# Patient Record
Sex: Female | Born: 1940 | Race: White | Hispanic: No | Marital: Single | Smoking: Former smoker
Health system: Southern US, Community
[De-identification: ages and names within clinical notes are randomized; demographics above are authoritative.]

## PROBLEM LIST (undated history)

## (undated) DIAGNOSIS — C55 Malignant neoplasm of uterus, part unspecified: Secondary | ICD-10-CM

## (undated) DIAGNOSIS — K5792 Diverticulitis of intestine, part unspecified, without perforation or abscess without bleeding: Secondary | ICD-10-CM

## (undated) DIAGNOSIS — C50919 Malignant neoplasm of unspecified site of unspecified female breast: Secondary | ICD-10-CM

## (undated) DIAGNOSIS — I10 Essential (primary) hypertension: Secondary | ICD-10-CM

## (undated) HISTORY — PX: ABDOMINAL HYSTERECTOMY: SHX81

## (undated) HISTORY — PX: EYE SURGERY: SHX253

## (undated) HISTORY — PX: BREAST LUMPECTOMY: SHX2

---

## 2018-04-13 ENCOUNTER — Emergency Department (HOSPITAL_BASED_OUTPATIENT_CLINIC_OR_DEPARTMENT_OTHER): Payer: Medicare Other

## 2018-04-13 ENCOUNTER — Encounter (HOSPITAL_BASED_OUTPATIENT_CLINIC_OR_DEPARTMENT_OTHER): Payer: Self-pay

## 2018-04-13 ENCOUNTER — Other Ambulatory Visit: Payer: Self-pay

## 2018-04-13 ENCOUNTER — Inpatient Hospital Stay (HOSPITAL_BASED_OUTPATIENT_CLINIC_OR_DEPARTMENT_OTHER)
Admission: EM | Admit: 2018-04-13 | Discharge: 2018-04-17 | DRG: 389 | Disposition: A | Discharge status: Home / Self Care | Payer: Medicare Other | Attending: Internal Medicine | Admitting: Internal Medicine

## 2018-04-13 DIAGNOSIS — E162 Hypoglycemia, unspecified: Secondary | ICD-10-CM | POA: Diagnosis not present

## 2018-04-13 DIAGNOSIS — N3000 Acute cystitis without hematuria: Secondary | ICD-10-CM | POA: Diagnosis not present

## 2018-04-13 DIAGNOSIS — Z888 Allergy status to other drugs, medicaments and biological substances status: Secondary | ICD-10-CM | POA: Diagnosis not present

## 2018-04-13 DIAGNOSIS — Z8249 Family history of ischemic heart disease and other diseases of the circulatory system: Secondary | ICD-10-CM | POA: Diagnosis not present

## 2018-04-13 DIAGNOSIS — I10 Essential (primary) hypertension: Secondary | ICD-10-CM | POA: Diagnosis present

## 2018-04-13 DIAGNOSIS — Z0189 Encounter for other specified special examinations: Secondary | ICD-10-CM

## 2018-04-13 DIAGNOSIS — K5651 Intestinal adhesions [bands], with partial obstruction: Principal | ICD-10-CM | POA: Diagnosis present

## 2018-04-13 DIAGNOSIS — N179 Acute kidney failure, unspecified: Secondary | ICD-10-CM | POA: Diagnosis present

## 2018-04-13 DIAGNOSIS — K56609 Unspecified intestinal obstruction, unspecified as to partial versus complete obstruction: Secondary | ICD-10-CM | POA: Diagnosis present

## 2018-04-13 DIAGNOSIS — Z8542 Personal history of malignant neoplasm of other parts of uterus: Secondary | ICD-10-CM | POA: Diagnosis not present

## 2018-04-13 DIAGNOSIS — Z90710 Acquired absence of both cervix and uterus: Secondary | ICD-10-CM

## 2018-04-13 DIAGNOSIS — Z79899 Other long term (current) drug therapy: Secondary | ICD-10-CM

## 2018-04-13 DIAGNOSIS — Z853 Personal history of malignant neoplasm of breast: Secondary | ICD-10-CM

## 2018-04-13 HISTORY — DX: Essential (primary) hypertension: I10

## 2018-04-13 HISTORY — DX: Malignant neoplasm of unspecified site of unspecified female breast: C50.919

## 2018-04-13 HISTORY — DX: Diverticulitis of intestine, part unspecified, without perforation or abscess without bleeding: K57.92

## 2018-04-13 HISTORY — DX: Malignant neoplasm of uterus, part unspecified: C55

## 2018-04-13 LAB — COMPREHENSIVE METABOLIC PANEL
ALK PHOS: 81 U/L (ref 38–126)
ALT: 27 U/L (ref 0–44)
AST: 39 U/L (ref 15–41)
Albumin: 4.9 g/dL (ref 3.5–5.0)
Anion gap: 14 (ref 5–15)
BUN: 54 mg/dL — ABNORMAL HIGH (ref 8–23)
CHLORIDE: 88 mmol/L — AB (ref 98–111)
CO2: 27 mmol/L (ref 22–32)
Calcium: 9.9 mg/dL (ref 8.9–10.3)
Creatinine, Ser: 2.58 mg/dL — ABNORMAL HIGH (ref 0.44–1.00)
GFR calc Af Amer: 20 mL/min — ABNORMAL LOW (ref >60)
GFR calc non Af Amer: 17 mL/min — ABNORMAL LOW (ref >60)
Glucose, Bld: 111 mg/dL — ABNORMAL HIGH (ref 70–99)
Potassium: 4.3 mmol/L (ref 3.5–5.1)
Sodium: 129 mmol/L — ABNORMAL LOW (ref 135–145)
Total Bilirubin: 1.6 mg/dL — ABNORMAL HIGH (ref 0.3–1.2)
Total Protein: 7.8 g/dL (ref 6.5–8.1)

## 2018-04-13 LAB — BASIC METABOLIC PANEL
Anion gap: 9 (ref 5–15)
BUN: 48 mg/dL — ABNORMAL HIGH (ref 8–23)
CO2: 28 mmol/L (ref 22–32)
Calcium: 9 mg/dL (ref 8.9–10.3)
Chloride: 91 mmol/L — ABNORMAL LOW (ref 98–111)
Creatinine, Ser: 2.06 mg/dL — ABNORMAL HIGH (ref 0.44–1.00)
GFR calc Af Amer: 26 mL/min — ABNORMAL LOW (ref >60)
GFR calc non Af Amer: 22 mL/min — ABNORMAL LOW (ref >60)
GLUCOSE: 127 mg/dL — AB (ref 70–99)
Potassium: 3.7 mmol/L (ref 3.5–5.1)
Sodium: 128 mmol/L — ABNORMAL LOW (ref 135–145)

## 2018-04-13 LAB — URINALYSIS, ROUTINE W REFLEX MICROSCOPIC
GLUCOSE, UA: NEGATIVE mg/dL
Ketones, ur: NEGATIVE mg/dL
Nitrite: NEGATIVE
Protein, ur: NEGATIVE mg/dL
pH: 5 (ref 5.0–8.0)

## 2018-04-13 LAB — CBC WITH DIFFERENTIAL/PLATELET
Abs Immature Granulocytes: 0.04 10*3/uL (ref 0.00–0.07)
Basophils Absolute: 0 10*3/uL (ref 0.0–0.1)
Basophils Relative: 0 %
Eosinophils Absolute: 0.1 10*3/uL (ref 0.0–0.5)
Eosinophils Relative: 1 %
HCT: 42.9 % (ref 36.0–46.0)
Hemoglobin: 13.5 g/dL (ref 12.0–15.0)
IMMATURE GRANULOCYTES: 0 %
Lymphocytes Relative: 13 %
Lymphs Abs: 1.7 10*3/uL (ref 0.7–4.0)
MCH: 30.1 pg (ref 26.0–34.0)
MCHC: 31.5 g/dL (ref 30.0–36.0)
MCV: 95.8 fL (ref 80.0–100.0)
Monocytes Absolute: 1.1 10*3/uL — ABNORMAL HIGH (ref 0.1–1.0)
Monocytes Relative: 9 %
Neutro Abs: 9.9 10*3/uL — ABNORMAL HIGH (ref 1.7–7.7)
Neutrophils Relative %: 77 %
Platelets: 277 10*3/uL (ref 150–400)
RBC: 4.48 MIL/uL (ref 3.87–5.11)
RDW: 12.8 % (ref 11.5–15.5)
WBC: 12.8 10*3/uL — ABNORMAL HIGH (ref 4.0–10.5)
nRBC: 0 % (ref 0.0–0.2)

## 2018-04-13 LAB — LIPASE, BLOOD: Lipase: 54 U/L — ABNORMAL HIGH (ref 11–51)

## 2018-04-13 LAB — URINALYSIS, MICROSCOPIC (REFLEX)

## 2018-04-13 MED ORDER — FENTANYL CITRATE (PF) 100 MCG/2ML IJ SOLN
25.0000 ug | Freq: Once | INTRAMUSCULAR | Status: AC
  Administered 2018-04-13: 25 ug via INTRAVENOUS
  Filled 2018-04-13: qty 2

## 2018-04-13 MED ORDER — ONDANSETRON HCL 4 MG/2ML IJ SOLN
4.0000 mg | Freq: Once | INTRAMUSCULAR | Status: AC
  Administered 2018-04-13: 4 mg via INTRAVENOUS

## 2018-04-13 MED ORDER — SODIUM CHLORIDE 0.9 % IV SOLN
INTRAVENOUS | Status: DC | PRN
  Administered 2018-04-13: 250 mL via INTRAVENOUS

## 2018-04-13 MED ORDER — LACTATED RINGERS IV BOLUS
2000.0000 mL | Freq: Once | INTRAVENOUS | Status: AC
  Administered 2018-04-13: 2000 mL via INTRAVENOUS

## 2018-04-13 MED ORDER — ONDANSETRON HCL 4 MG/2ML IJ SOLN
INTRAMUSCULAR | Status: AC
  Filled 2018-04-13: qty 2

## 2018-04-13 MED ORDER — SODIUM CHLORIDE 0.9 % IV SOLN
1.0000 g | Freq: Once | INTRAVENOUS | Status: AC
  Administered 2018-04-13: 1 g via INTRAVENOUS
  Filled 2018-04-13: qty 10

## 2018-04-13 NOTE — ED Notes (Signed)
US in progress at bedside.

## 2018-04-13 NOTE — ED Provider Notes (Signed)
Emergency Department Provider Note   I have reviewed the triage vital signs and the nursing notes.   HISTORY  Chief Complaint Abdominal Pain   HPI Chelsea Petersen is a 78 y.o. female with a history of uterine cancer, breast cancer, diverticulitis hypertension the presents to the emergency department today secondary to lower abdominal pain.  Patient states that she started having some pain Tuesday afternoon and in between Tuesday and Wednesday she had 3 or 4 episodes of nonbloody nonbilious vomiting it was just stomach acid.  Patient states that she has not vomited since yesterday however the pain is localized to just her suprapubic area.  She states that originally was more widespread but now is just there.  Maybe a little bit off the left side as well.  This is not the place where she previously had diverticulitis.  She is not had any fevers.  She has not had any diarrhea in fact is not had a bowel movement since Tuesday either.  She does have a history of a hysterectomy.  No history of bowel obstructions.  She usually takes fiber but secondary to her pain, nausea and vomiting she actually has not taken her fiber in the last couple days.  She states that she did not urinate for approximate 24 hours but she did about an hour prior to coming here and it seemed to be orange in color however she attributes this to having orange Pedialyte prior to coming in.  She feels that she needs to urinate again at this time. No other associated or modifying symptoms.    Past Medical History:  Diagnosis Date  . Breast cancer ([place])   . Diverticulitis   . Hypertension   . Uterine cancer [facility])     Patient Active Problem List   Diagnosis Date Noted  . SBO (small bowel obstruction) ([place]) 04/13/2018    Past Surgical History:  Procedure Laterality Date  . ABDOMINAL HYSTERECTOMY    . BREAST LUMPECTOMY    . EYE SURGERY        Allergies Other  No family history on file.  Social History Social  History   Tobacco Use  . Smoking status: Never Smoker  . Smokeless tobacco: Never Used  Substance Use Topics  . Alcohol use: Yes    Comment: occ  . Drug use: Never    Review of Systems  All other systems negative except as documented in the HPI. All pertinent positives and negatives as reviewed in the HPI. ____________________________________________   PHYSICAL EXAM:  VITAL SIGNS: ED Triage Vitals  Enc Vitals Group     BP 04/13/18 1605 (!) 141/82     Pulse Rate 04/13/18 1605 98     Resp 04/13/18 1605 16     Temp 04/13/18 1605 98.1 F (36.7 C)     Temp Source 04/13/18 1605 Oral     SpO2 04/13/18 1605 100 %     Weight 04/13/18 1605 110 lb (49.9 kg)     Height 04/13/18 1605 5' (1.524 m)    Constitutional: Alert and oriented. Well appearing and in no acute distress. Eyes: Conjunctivae are normal. PERRL. EOMI. Head: Atraumatic. Nose: No congestion/rhinnorhea. Mouth/Throat: Mucous membranes are moist.  Oropharynx non-erythematous. Neck: No stridor.  No meningeal signs.   Cardiovascular: Normal rate, regular rhythm. Good peripheral circulation. Grossly normal heart sounds.   Respiratory: Normal respiratory effort.  No retractions. Lungs CTAB. Gastrointestinal: Soft and ttp suprapubic with guarding, no rebound, no pain with heel tap. Mild distention.  Musculoskeletal: No lower extremity tenderness nor edema. No gross deformities of extremities. Neurologic:  Normal speech and language. No gross focal neurologic deficits are appreciated.  Skin:  Skin is warm, dry and intact. No rash noted.  ____________________________________________   LABS (all labs ordered are listed, but only abnormal results are displayed)  Labs Reviewed  URINALYSIS, ROUTINE W REFLEX MICROSCOPIC - Abnormal; Notable for the following components:      Result Value   APPearance HAZY (*)    Specific Gravity, Urine >1.030 (*)    Hgb urine dipstick TRACE (*)    Bilirubin Urine SMALL (*)    Leukocytes,Ua  TRACE (*)    All other components within normal limits  CBC WITH DIFFERENTIAL/PLATELET - Abnormal; Notable for the following components:   WBC 12.8 (*)    Neutro Abs 9.9 (*)    Monocytes Absolute 1.1 (*)    All other components within normal limits  COMPREHENSIVE METABOLIC PANEL - Abnormal; Notable for the following components:   Sodium 129 (*)    Chloride 88 (*)    Glucose, Bld 111 (*)    BUN 54 (*)    Creatinine, Ser 2.58 (*)    Total Bilirubin 1.6 (*)    GFR calc non Af Amer 17 (*)    GFR calc Af Amer 20 (*)    All other components within normal limits  LIPASE, BLOOD - Abnormal; Notable for the following components:   Lipase 54 (*)    All other components within normal limits  URINALYSIS, MICROSCOPIC (REFLEX) - Abnormal; Notable for the following components:   Bacteria, UA FEW (*)    All other components within normal limits  BASIC METABOLIC PANEL - Abnormal; Notable for the following components:   Sodium 128 (*)    Chloride 91 (*)    Glucose, Bld 127 (*)    BUN 48 (*)    Creatinine, Ser 2.06 (*)    GFR calc non Af Amer 22 (*)    GFR calc Af Amer 26 (*)    All other components within normal limits  URINE CULTURE   ____________________________________________  EKG   EKG Interpretation  Date/Time:    Ventricular Rate:    PR Interval:    QRS Duration:   QT Interval:    QTC Calculation:   R Axis:     Text Interpretation:         ____________________________________________  RADIOLOGY  Ct Abdomen Pelvis Wo Contrast  Result Date: 04/13/2018 CLINICAL DATA:  Lower abdominal pain with nausea and vomiting for 2 days. EXAM: CT ABDOMEN AND PELVIS WITHOUT CONTRAST TECHNIQUE: Multidetector CT imaging of the abdomen and pelvis was performed following the standard protocol without IV contrast. COMPARISON:  Ultrasound kidneys 04/13/2018 FINDINGS: Lower chest: Atelectasis in the lung bases. 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 Coronary artery  calcifications. Mild cardiac enlargement. Hepatobiliary: No focal liver lesions. Gallbladder is contracted but otherwise normal. No bile duct dilatation. Pancreas: Unremarkable. No pancreatic ductal dilatation or surrounding inflammatory changes. Spleen: Normal in size without focal abnormality. Adrenals/Urinary Tract: No adrenal gland nodules. Stone in the lower pole left kidney measuring 3 mm diameter. No hydronephrosis or hydroureter. Bladder is unremarkable. Stomach/Bowel: Market distention of fluid-filled stomach and small bowel. Distal small bowel are decompressed. Transition zone appears to be in the left lower quadrant. No cause is identified, suggesting probable adhesions. Appearance suggest high-grade obstruction. Scattered stool throughout the colon without colonic distention. Colonic diverticulosis without evidence of diverticulitis. Appendix is normal.  Vascular/Lymphatic: Extensive aortic and vascular calcifications. Reproductive: Status post hysterectomy. No adnexal masses. Other: Small amount of free fluid in the upper abdomen, likely ascites or reactive fluid. No free air. Abdominal wall musculature appears intact. Musculoskeletal: Degenerative changes in the lumbar spine. Mild scoliosis convex towards the right, likely degenerative. IMPRESSION: 1. High-grade small bowel obstruction with transition zone in the left lower quadrant. No cause is identified, suggesting adhesions. 2. Small amount of free fluid in the abdomen, likely ascites or reactive fluid. 3. Nonobstructing stone in the lower pole left kidney. 4. Colonic diverticulosis without evidence of diverticulitis. Aortic Atherosclerosis (ICD10-I70.0). Electronically Signed   By: Lucienne Capers M.D.   On: 04/13/2018 22:49   US Renal  Result Date: 04/13/2018 CLINICAL DATA:  Renal failure EXAM: RENAL / URINARY TRACT ULTRASOUND COMPLETE COMPARISON:  None. FINDINGS: Right Kidney: Renal measurements: 9.5 x 5.2 x 5.1 cm = volume: 132 mL .  Echogenicity within normal limits. No mass or hydronephrosis visualized. Left Kidney: Renal measurements: 9.4 x 4.6 x 4.0 cm = volume: 91 mL. Echogenicity within normal limits. No mass or hydronephrosis visualized. Small echogenic foci within the left kidney, the largest 5 mm in the lower pole, likely nonobstructing stones. Bladder: Decompressed, grossly unremarkable. IMPRESSION: No acute findings.  No hydronephrosis. Suspect small nonobstructing left renal stones. Electronically Signed   By: Rolm Baptise M.D.   On: 04/13/2018 19:06    ____________________________________________   PROCEDURES  Procedure(s) performed:   Procedures   ____________________________________________   INITIAL IMPRESSION / ASSESSMENT AND PLAN / ED COURSE  Patient overall appears well however does not make sense for her to have such localized pain and tenderness if it was just a viral gastroenteritis.  She also did not have diarrhea does not really have crampy pain and did not have profuse vomiting.  Other concern would be for small bowel obstruction since she is not had a bowel movement in the last 2 or 3 days or large bowel obstruction for that matter.  Her abdomen is mildly distended and not diffusely tender not tympanic to percussion making a little bit less likely.  Also consider urinary tract infection with the suprapubic pain decreased urine output and abnormal color to her urine.  Plan at this time will check some basic blood work and urinalysis.  Postvoid residual was 0.  If her urine comes back obviously infected we will treat that however I will have a low threshold for CT scan for bowel obstruction if any question about urinary tract infection.  Urine does look infected and she has acute kidney injury so gust with the patient her daughter who is a Equities trader and we agreed to fluid hydrate and treat her urinary tract infection recheck her BMP to ensure her kidney function started to improve prior to  discharge.  Reevaluation after the fluids and improving BUN/creatinine patient stated her abdomen pain seemed to feel that worse.  On exam her pain initially was only in the suprapubic area but now she was having tenderness in the left lower quadrant left upper quadrant epigastric area.  She started having some tympany to percussion and mild guarding.  I discussed with him that I was worried that she might have a bowel obstruction as her kidney function did not improve as much as expected to she still has not had any bowel movement here and not passing gas.  CT scan was performed which did show a high-grade small bowel obstruction.  Discussed with Dr. Harlow Asa at [facility]  who will see her in consult per request medicine admission.  Discussed with Dr. Eugenia Pancoast who will admit.     Pertinent labs & imaging results that were available during my care of the patient were reviewed by me and considered in my medical decision making (see chart for details).  ____________________________________________  FINAL CLINICAL IMPRESSION(S) / ED DIAGNOSES  Final diagnoses:  Small bowel obstruction ([place])  AKI (acute kidney injury) ([place])  Acute cystitis without hematuria     MEDICATIONS GIVEN DURING THIS VISIT:  Medications  0.9 %  sodium chloride infusion ( Intravenous Stopped 04/13/18 1917)  lactated ringers bolus 2,000 mL ( Intravenous Stopped 04/13/18 1951)  cefTRIAXone (ROCEPHIN) 1 g in sodium chloride 0.9 % 100 mL IVPB ( Intravenous Stopped 04/13/18 1815)  fentaNYL (SUBLIMAZE) injection 25 mcg (25 mcg Intravenous Given 04/13/18 2319)  ondansetron (ZOFRAN) injection 4 mg (4 mg Intravenous Given 04/13/18 2319)     NEW OUTPATIENT MEDICATIONS STARTED DURING THIS VISIT:  New Prescriptions   No medications on file    Note:  This note was prepared with assistance of Dragon voice recognition software. Occasional wrong-word or sound-a-like substitutions may have occurred due to the inherent limitations of  voice recognition software.   Raif Chachere, Corene Cornea, MD 04/14/18 [phone]

## 2018-04-13 NOTE — Progress Notes (Signed)
78 y.o. female with history of hysterectomy, who I have accepted for transfer from [place] [facility] ED to [facility] for further evaluation and management of SBO, after the patient presented to the former facility complaining of 1 to 2 days of abdominal discomfort, diminished flatus production, and no bowel movement since 04/12/2018.  CT abdomen/pelvis showed evidence of high-grade small bowel obstruction with transition zone in the left lower quadrant without evidence of abscess or perforation.   In accepting this patient for transfer to [facility], I spoke with Dr. Merrily Pew, MD, who was the ED physician at [facility].   Babs Bertin, DO Hospitalist

## 2018-04-13 NOTE — ED Triage Notes (Signed)
C/o lower abd pain, n/v x 2 days-NAD-steady gait

## 2018-04-14 ENCOUNTER — Encounter (HOSPITAL_COMMUNITY): Payer: Self-pay | Admitting: Internal Medicine

## 2018-04-14 ENCOUNTER — Inpatient Hospital Stay (HOSPITAL_COMMUNITY): Payer: Medicare Other

## 2018-04-14 DIAGNOSIS — N3000 Acute cystitis without hematuria: Secondary | ICD-10-CM

## 2018-04-14 DIAGNOSIS — K56609 Unspecified intestinal obstruction, unspecified as to partial versus complete obstruction: Secondary | ICD-10-CM

## 2018-04-14 DIAGNOSIS — N179 Acute kidney failure, unspecified: Secondary | ICD-10-CM

## 2018-04-14 DIAGNOSIS — Z0189 Encounter for other specified special examinations: Secondary | ICD-10-CM

## 2018-04-14 DIAGNOSIS — I10 Essential (primary) hypertension: Secondary | ICD-10-CM | POA: Diagnosis present

## 2018-04-14 LAB — CBC WITH DIFFERENTIAL/PLATELET
ABS IMMATURE GRANULOCYTES: 0.04 10*3/uL (ref 0.00–0.07)
Basophils Absolute: 0 10*3/uL (ref 0.0–0.1)
Basophils Relative: 0 %
Eosinophils Absolute: 0 10*3/uL (ref 0.0–0.5)
Eosinophils Relative: 0 %
HCT: 40.2 % (ref 36.0–46.0)
Hemoglobin: 13 g/dL (ref 12.0–15.0)
Immature Granulocytes: 0 %
LYMPHS PCT: 9 %
Lymphs Abs: 0.9 10*3/uL (ref 0.7–4.0)
MCH: 31.1 pg (ref 26.0–34.0)
MCHC: 32.3 g/dL (ref 30.0–36.0)
MCV: 96.2 fL (ref 80.0–100.0)
Monocytes Absolute: 1.1 10*3/uL — ABNORMAL HIGH (ref 0.1–1.0)
Monocytes Relative: 11 %
Neutro Abs: 7.8 10*3/uL — ABNORMAL HIGH (ref 1.7–7.7)
Neutrophils Relative %: 80 %
Platelets: 237 10*3/uL (ref 150–400)
RBC: 4.18 MIL/uL (ref 3.87–5.11)
RDW: 12.6 % (ref 11.5–15.5)
WBC: 10 10*3/uL (ref 4.0–10.5)
nRBC: 0 % (ref 0.0–0.2)

## 2018-04-14 LAB — COMPREHENSIVE METABOLIC PANEL
ALT: 25 U/L (ref 0–44)
AST: 28 U/L (ref 15–41)
Albumin: 3.8 g/dL (ref 3.5–5.0)
Alkaline Phosphatase: 64 U/L (ref 38–126)
Anion gap: 9 (ref 5–15)
BILIRUBIN TOTAL: 1.3 mg/dL — AB (ref 0.3–1.2)
BUN: 38 mg/dL — ABNORMAL HIGH (ref 8–23)
CO2: 29 mmol/L (ref 22–32)
Calcium: 9.3 mg/dL (ref 8.9–10.3)
Chloride: 94 mmol/L — ABNORMAL LOW (ref 98–111)
Creatinine, Ser: 1.42 mg/dL — ABNORMAL HIGH (ref 0.44–1.00)
GFR calc Af Amer: 41 mL/min — ABNORMAL LOW (ref >60)
GFR, EST NON AFRICAN AMERICAN: 35 mL/min — AB (ref >60)
Glucose, Bld: 117 mg/dL — ABNORMAL HIGH (ref 70–99)
Potassium: 3.8 mmol/L (ref 3.5–5.1)
Sodium: 132 mmol/L — ABNORMAL LOW (ref 135–145)
TOTAL PROTEIN: 6.3 g/dL — AB (ref 6.5–8.1)

## 2018-04-14 LAB — GLUCOSE, CAPILLARY
Glucose-Capillary: 96 mg/dL (ref 70–99)
Glucose-Capillary: 72 mg/dL (ref 70–99)

## 2018-04-14 MED ORDER — ONDANSETRON HCL 4 MG/2ML IJ SOLN
4.0000 mg | Freq: Four times a day (QID) | INTRAMUSCULAR | Status: DC | PRN
  Administered 2018-04-14 (×2): 4 mg via INTRAVENOUS
  Filled 2018-04-14 (×2): qty 2

## 2018-04-14 MED ORDER — HYDRALAZINE HCL 20 MG/ML IJ SOLN: 5.0000 mg | INTRAMUSCULAR | Status: DC | PRN

## 2018-04-14 MED ORDER — LACTATED RINGERS IV SOLN
INTRAVENOUS | Status: AC
  Administered 2018-04-14 – 2018-04-15 (×2): via INTRAVENOUS

## 2018-04-14 MED ORDER — SODIUM CHLORIDE 0.9 % IV SOLN
1.0000 g | INTRAVENOUS | Status: DC
  Administered 2018-04-14 – 2018-04-16 (×3): 1 g via INTRAVENOUS
  Filled 2018-04-14 (×3): qty 1

## 2018-04-14 MED ORDER — FENTANYL CITRATE (PF) 100 MCG/2ML IJ SOLN: 25.0000 ug | INTRAMUSCULAR | Status: DC | PRN

## 2018-04-14 MED ORDER — DIATRIZOATE MEGLUMINE & SODIUM 66-10 % PO SOLN
90.0000 mL | Freq: Once | ORAL | Status: AC
  Administered 2018-04-14: 90 mL via NASOGASTRIC
  Filled 2018-04-14: qty 90

## 2018-04-14 MED ORDER — ACETAMINOPHEN 325 MG PO TABS
650.0000 mg | ORAL_TABLET | Freq: Four times a day (QID) | ORAL | Status: DC | PRN
  Administered 2018-04-17: 650 mg via ORAL
  Filled 2018-04-14: qty 2

## 2018-04-14 MED ORDER — ONDANSETRON HCL 4 MG PO TABS: 4.0000 mg | ORAL_TABLET | Freq: Four times a day (QID) | ORAL | Status: DC | PRN

## 2018-04-14 MED ORDER — ACETAMINOPHEN 650 MG RE SUPP: 650.0000 mg | Freq: Four times a day (QID) | RECTAL | Status: DC | PRN

## 2018-04-14 NOTE — H&P (Signed)
History and Physical    Chelsea Petersen NGE:[id number] DOB: December 19, 1940 DOA: 04/13/2018  PCP: [name] I, NP  Patient coming from: Home.  Chief Complaint: Abdominal pain.  HPI: Chelsea Petersen is a 78 y.o. female with history of hypertension, breast cancer in remission presented to the ER at [facility] with complaints of abdominal pain.  Patient has been having abdominal pain in the lower quadrant for the last 3 days and on the first day of the pain patient had at least 2-3 episodes of vomiting.  Subsequently which patient had at least one episode last 2 days.  Has not moved her bowels last 3 days.  And also has not had any flatus.  Denies any fever chills.  ED Course: In the ER CT scan shows high-grade small bowel obstruction with transition point.  Likely from medications per the CAT scan report.  UA shows possibility of UTI.  And creatinine was around 2.5 with normal creatinine to compare.  WBC count was 12.8.  Patient was placed on ceftriaxone for UTI and since patient had no further vomiting NG tube was not placed.  Dr. Harlow Asa on-call general surgery was consulted and patient admitted for further management.  Review of Systems: As per HPI, rest all negative.   Past Medical History:  Diagnosis Date  . Breast cancer ([place])   . Diverticulitis   . Hypertension   . Uterine cancer [facility])     Past Surgical History:  Procedure Laterality Date  . ABDOMINAL HYSTERECTOMY    . BREAST LUMPECTOMY    . EYE SURGERY       reports that she has never smoked. She has never used smokeless tobacco. She reports current alcohol use. She reports that she does not use drugs.  Allergies  Allergen Reactions  . Other Other (See Comments)    "eye drops" ([facility])    Family History  Problem Relation Age of Onset  . Hypertension Mother   . Colon cancer Neg Hx   . Breast cancer Neg Hx     Prior to Admission medications   Medication Sig Start Date End Date Taking? Authorizing Provider    ACIDOPHILUS LACTOBACILLUS PO Take 1 capsule by mouth daily.   Yes [provider]  celecoxib (CELEBREX) 200 MG capsule Take 200 mg by mouth daily with breakfast. 03/10/18  Yes [provider]  Cholecalciferol (VITAMIN D3) 25 MCG (1000 UT) CAPS Take 1,000 Units by mouth daily.   Yes [provider]  DYMISTA 137-50 MCG/ACT SUSP Place 1 spray into both nostrils every evening. 02/06/18  Yes [provider]  lisinopril (PRINIVIL,ZESTRIL) 10 MG tablet Take 10 mg by mouth daily. 02/10/18  Yes [provider]  Multiple Vitamin (MULTIVITAMIN) capsule Take 1 capsule by mouth daily.   Yes [provider]    Physical Exam: Vitals:   04/13/18 2002 04/13/18 2347 04/14/18 0125 04/14/18 0135  BP: (!) 137/56 (!) 142/55 (!) 141/69   Pulse: 84 74 83   Resp: 16 16 16    Temp:   98.5 F (36.9 C)   TempSrc:   Oral   SpO2: 95% 94% 97%   Weight:    51.5 kg  Height:    5' (1.524 m)      Constitutional: Moderately built and nourished. Vitals:   04/13/18 2002 04/13/18 2347 04/14/18 0125 04/14/18 0135  BP: (!) 137/56 (!) 142/55 (!) 141/69   Pulse: 84 74 83   Resp: 16 16 16    Temp:  98.5 F (36.9 C)   TempSrc:   Oral   SpO2: 95% 94% 97%   Weight:    51.5 kg  Height:    5' (1.524 m)   Eyes: Anicteric no pallor. ENMT: No discharge from the ears eyes nose and mouth. Neck: No mass felt.  No neck rigidity. Respiratory: No rhonchi or crepitations. Cardiovascular: S1-S2 heard. Abdomen: Soft mildly distended bowel sounds not appreciated.  No guarding no rigidity or rebound tenderness. Musculoskeletal: No edema.  Joint effusion. Skin: No rash. Neurologic: Alert awake oriented to time place and person.  Moves all extremities. Psychiatric: Appears normal per normal affect.   Labs on Admission: I have personally reviewed following labs and imaging studies  CBC: Recent Labs  Lab 04/13/18 1631  WBC 12.8*  NEUTROABS 9.9*  HGB 13.5  HCT 42.9  MCV  95.8  PLT 469   Basic Metabolic Panel: Recent Labs  Lab 04/13/18 1631 04/13/18 2057  NA 129* 128*  K 4.3 3.7  CL 88* 91*  CO2 27 28  GLUCOSE 111* 127*  BUN 54* 48*  CREATININE 2.58* 2.06*  CALCIUM 9.9 9.0   GFR: Estimated Creatinine Clearance: 15.9 mL/min (A) (by C-G formula based on SCr of 2.06 mg/dL (H)). Liver Function Tests: Recent Labs  Lab 04/13/18 1631  AST 39  ALT 27  ALKPHOS 81  BILITOT 1.6*  PROT 7.8  ALBUMIN 4.9   Recent Labs  Lab 04/13/18 1631  LIPASE 54*   No results for input(s): AMMONIA in the last 168 hours. Coagulation Profile: No results for input(s): INR, PROTIME in the last 168 hours. Cardiac Enzymes: No results for input(s): CKTOTAL, CKMB, CKMBINDEX, TROPONINI in the last 168 hours. BNP (last 3 results) No results for input(s): PROBNP in the last 8760 hours. HbA1C: No results for input(s): HGBA1C in the last 72 hours. CBG: No results for input(s): GLUCAP in the last 168 hours. Lipid Profile: No results for input(s): CHOL, HDL, LDLCALC, TRIG, CHOLHDL, LDLDIRECT in the last 72 hours. Thyroid Function Tests: No results for input(s): TSH, T4TOTAL, FREET4, T3FREE, THYROIDAB in the last 72 hours. Anemia Panel: No results for input(s): VITAMINB12, FOLATE, FERRITIN, TIBC, IRON, RETICCTPCT in the last 72 hours. Urine analysis:    Component Value Date/Time   COLORURINE YELLOW 04/13/2018 1631   APPEARANCEUR HAZY (A) 04/13/2018 1631   LABSPEC >1.030 (H) 04/13/2018 1631   PHURINE 5.0 04/13/2018 1631   GLUCOSEU NEGATIVE 04/13/2018 1631   HGBUR TRACE (A) 04/13/2018 1631   BILIRUBINUR SMALL (A) 04/13/2018 1631   KETONESUR NEGATIVE 04/13/2018 1631   PROTEINUR NEGATIVE 04/13/2018 1631   NITRITE NEGATIVE 04/13/2018 1631   LEUKOCYTESUR TRACE (A) 04/13/2018 1631   Sepsis Labs: @LABRCNTIP (procalcitonin:4,lacticidven:4) )No results found for this or any previous visit (from the past 240 hour(s)).   Radiological Exams on Admission: Ct Abdomen  Pelvis Wo Contrast  Result Date: 04/13/2018 CLINICAL DATA:  Lower abdominal pain with nausea and vomiting for 2 days. EXAM: CT ABDOMEN AND PELVIS WITHOUT CONTRAST TECHNIQUE: Multidetector CT imaging of the abdomen and pelvis was performed following the standard protocol without IV contrast. COMPARISON:  Ultrasound kidneys 04/13/2018 FINDINGS: Lower chest: Atelectasis in the lung bases. 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 Coronary artery calcifications. Mild cardiac enlargement. Hepatobiliary: No focal liver lesions. Gallbladder is contracted but otherwise normal. No bile duct dilatation. Pancreas: Unremarkable. No pancreatic ductal dilatation or surrounding inflammatory changes. Spleen: Normal in size without focal abnormality. Adrenals/Urinary Tract: No adrenal gland nodules. Stone in the lower  pole left kidney measuring 3 mm diameter. No hydronephrosis or hydroureter. Bladder is unremarkable. Stomach/Bowel: Market distention of fluid-filled stomach and small bowel. Distal small bowel are decompressed. Transition zone appears to be in the left lower quadrant. No cause is identified, suggesting probable adhesions. Appearance suggest high-grade obstruction. Scattered stool throughout the colon without colonic distention. Colonic diverticulosis without evidence of diverticulitis. Appendix is normal. Vascular/Lymphatic: Extensive aortic and vascular calcifications. Reproductive: Status post hysterectomy. No adnexal masses. Other: Small amount of free fluid in the upper abdomen, likely ascites or reactive fluid. No free air. Abdominal wall musculature appears intact. Musculoskeletal: Degenerative changes in the lumbar spine. Mild scoliosis convex towards the right, likely degenerative. IMPRESSION: 1. High-grade small bowel obstruction with transition zone in the left lower quadrant. No cause is identified, suggesting adhesions. 2. Small amount of free fluid in the abdomen, likely ascites or  reactive fluid. 3. Nonobstructing stone in the lower pole left kidney. 4. Colonic diverticulosis without evidence of diverticulitis. Aortic Atherosclerosis (ICD10-I70.0). Electronically Signed   By: Lucienne Capers M.D.   On: 04/13/2018 22:49   US Renal  Result Date: 04/13/2018 CLINICAL DATA:  Renal failure EXAM: RENAL / URINARY TRACT ULTRASOUND COMPLETE COMPARISON:  None. FINDINGS: Right Kidney: Renal measurements: 9.5 x 5.2 x 5.1 cm = volume: 132 mL . Echogenicity within normal limits. No mass or hydronephrosis visualized. Left Kidney: Renal measurements: 9.4 x 4.6 x 4.0 cm = volume: 91 mL. Echogenicity within normal limits. No mass or hydronephrosis visualized. Small echogenic foci within the left kidney, the largest 5 mm in the lower pole, likely nonobstructing stones. Bladder: Decompressed, grossly unremarkable. IMPRESSION: No acute findings.  No hydronephrosis. Suspect small nonobstructing left renal stones. Electronically Signed   By: Rolm Baptise M.D.   On: 04/13/2018 19:06     Assessment/Plan Active Problems:   SBO (small bowel obstruction) ([facility])   AKI (acute kidney injury) ([place])   Acute cystitis without hematuria   Essential hypertension    1. Small bowel obstruction -likely could be from ideations.  Presently not on NG tube.  We will keep patient n.p.o. IV fluids pain relief medications.  General surgery has been consulted.  Check x-rays of the abdomen in the morning. 2. Acute kidney injury no old labs to compare.  Likely from vomiting and patient also using ACE inhibitor's.  Hydrate and recheck metabolic panel.  Closely follow intake output. 3. Possible UTI on ceftriaxone follow cultures. 4. Hypertension we will keep patient on PRN IV hydralazine for now. 5. History of breast cancer in remission.   DVT prophylaxis: SCDs. Code Status: Full code. Family Communication: Discussed with patient. Disposition Plan: Home. Consults called: General surgery. Admission status:  Inpatient.   Rise Patience MD [facility] Pager [phone].  If 7PM-7AM, please contact night-coverage [email or web address] Password [facility]  04/14/2018, 2:59 AM

## 2018-04-14 NOTE — Consult Note (Signed)
[facility] Surgery Consult Note  Chelsea Petersen 12-25-1940  [id number].    Requesting MD: Marylu Lund Chief Complaint/Reason for Consult: SBO  HPI:  Chelsea Petersen is a 78yo female transferred from [facility] to [facility] last night with SBO. Patient states that she was doing well until 3 days ago. She started having intermittent crampy abdominal pain. Pain was in her lower abdomen. Associated with abdominal bloating, nausea, and multiple episodes of emesis. Last BM was on 2/25. Not passing flatus. She has never had an SBO before. Symptoms gradually worsening so she decided to go to the ED. CT scan shows high-grade small bowel obstruction with transition zone in the left lower quadrant.  General surgery asked to see.  PMH significant for HTN, h/o breast cancer Abdominal surgical history: hysterectomy Anticoagulants: none Nonsmoker Lives in independent living with her husband  ROS: Review of Systems  Constitutional: Negative.   HENT: Negative.   Eyes: Negative.   Respiratory: Negative.   Cardiovascular: Negative.   Gastrointestinal: Positive for abdominal pain, constipation, nausea and vomiting. Negative for diarrhea.  Genitourinary: Negative.   Musculoskeletal: Negative.   Skin: Negative.   Neurological: Negative.    All systems reviewed and otherwise negative except for as above  Family History  Problem Relation Age of Onset  . Hypertension Mother   . Colon cancer Neg Hx   . Breast cancer Neg Hx     Past Medical History:  Diagnosis Date  . Breast cancer ([place])   . Diverticulitis   . Hypertension   . Uterine cancer [facility])     Past Surgical History:  Procedure Laterality Date  . ABDOMINAL HYSTERECTOMY    . BREAST LUMPECTOMY    . EYE SURGERY      Social History:  reports that she has never smoked. She has never used smokeless tobacco. She reports current alcohol use. She reports that she does not use drugs.  Allergies:  Allergies  Allergen Reactions  . Other  Other (See Comments)    "eye drops" (PVA)    Medications Prior to Admission  Medication Sig Dispense Refill  . ACIDOPHILUS LACTOBACILLUS PO Take 1 capsule by mouth daily.    . celecoxib (CELEBREX) 200 MG capsule Take 200 mg by mouth daily with breakfast.    . Cholecalciferol (VITAMIN D3) 25 MCG (1000 UT) CAPS Take 1,000 Units by mouth daily.    Marland Kitchen DYMISTA 137-50 MCG/ACT SUSP Place 1 spray into both nostrils every evening.    Marland Kitchen lisinopril (PRINIVIL,ZESTRIL) 10 MG tablet Take 10 mg by mouth daily.    . Multiple Vitamin (MULTIVITAMIN) capsule Take 1 capsule by mouth daily.      Prior to Admission medications   Medication Sig Start Date End Date Taking? Authorizing Provider  ACIDOPHILUS LACTOBACILLUS PO Take 1 capsule by mouth daily.   Yes [provider]  celecoxib (CELEBREX) 200 MG capsule Take 200 mg by mouth daily with breakfast. 03/10/18  Yes [provider]  Cholecalciferol (VITAMIN D3) 25 MCG (1000 UT) CAPS Take 1,000 Units by mouth daily.   Yes [provider]  DYMISTA 137-50 MCG/ACT SUSP Place 1 spray into both nostrils every evening. 02/06/18  Yes [provider]  lisinopril (PRINIVIL,ZESTRIL) 10 MG tablet Take 10 mg by mouth daily. 02/10/18  Yes [provider]  Multiple Vitamin (MULTIVITAMIN) capsule Take 1 capsule by mouth daily.   Yes [provider]    Blood pressure (!) 152/61, pulse 72, temperature 98.8 F (37.1 C), temperature source Oral, resp. rate 16,  height 5' (1.524 m), weight 51.5 kg, SpO2 94 %.  Physical Exam: General: pleasant, WD/WN white female who is laying in bed in NAD HEENT: head is normocephalic, atraumatic.  Sclera are noninjected.  Pupils equal and round.  Ears and nose without any masses or lesions.  Mouth is pink and moist. Dentition fair Heart: regular, rate, and rhythm.  No obvious murmurs, gallops, or rubs noted.  Palpable pedal pulses bilaterally Lungs: CTAB, no wheezes, rhonchi, or rales noted.   Respiratory effort nonlabored Abd: soft, distended, +BS, no masses, hernias, or organomegaly. Mild lower abdominal TTP without rebound or guarding MS: all 4 extremities are symmetrical with no cyanosis, clubbing, or edema. Skin: warm and dry with no masses, lesions, or rashes Psych: A&Ox3 with an appropriate affect. Neuro: cranial nerves grossly intact, extremity CSM intact bilaterally, normal speech  Results for orders placed or performed during the hospital encounter of 04/13/18 (from the past 48 hour(s))  Urinalysis, Routine w reflex microscopic     Status: Abnormal   Collection Time: 04/13/18  4:31 PM  Result Value Ref Range   Color, Urine YELLOW YELLOW   APPearance HAZY (A) CLEAR   Specific Gravity, Urine >1.030 (H) 1.005 - 1.030   pH 5.0 5.0 - 8.0   Glucose, UA NEGATIVE NEGATIVE mg/dL   Hgb urine dipstick TRACE (A) NEGATIVE   Bilirubin Urine SMALL (A) NEGATIVE   Ketones, ur NEGATIVE NEGATIVE mg/dL   Protein, ur NEGATIVE NEGATIVE mg/dL   Nitrite NEGATIVE NEGATIVE   Leukocytes,Ua TRACE (A) NEGATIVE    Comment: Performed at [facility], [address] [place], [place] [address]  CBC with Differential     Status: Abnormal   Collection Time: 04/13/18  4:31 PM  Result Value Ref Range   WBC 12.8 (H) 4.0 - 10.5 K/uL   RBC 4.48 3.87 - 5.11 MIL/uL   Hemoglobin 13.5 12.0 - 15.0 g/dL   HCT 42.9 36.0 - 46.0 %   MCV 95.8 80.0 - 100.0 fL   MCH 30.1 26.0 - 34.0 pg   MCHC 31.5 30.0 - 36.0 g/dL   RDW 12.8 11.5 - 15.5 %   Platelets 277 150 - 400 K/uL   nRBC 0.0 0.0 - 0.2 %   Neutrophils Relative % 77 %   Neutro Abs 9.9 (H) 1.7 - 7.7 K/uL   Lymphocytes Relative 13 %   Lymphs Abs 1.7 0.7 - 4.0 K/uL   Monocytes Relative 9 %   Monocytes Absolute 1.1 (H) 0.1 - 1.0 K/uL   Eosinophils Relative 1 %   Eosinophils Absolute 0.1 0.0 - 0.5 K/uL   Basophils Relative 0 %   Basophils Absolute 0.0 0.0 - 0.1 K/uL   Immature Granulocytes 0 %   Abs Immature Granulocytes 0.04 0.00 - 0.07  K/uL    Comment: Performed at [facility], [address] [place], [place] [address]  Comprehensive metabolic panel     Status: Abnormal   Collection Time: 04/13/18  4:31 PM  Result Value Ref Range   Sodium 129 (L) 135 - 145 mmol/L   Potassium 4.3 3.5 - 5.1 mmol/L   Chloride 88 (L) 98 - 111 mmol/L   CO2 27 22 - 32 mmol/L   Glucose, Bld 111 (H) 70 - 99 mg/dL   BUN 54 (H) 8 - 23 mg/dL   Creatinine, Ser 2.58 (H) 0.44 - 1.00 mg/dL   Calcium 9.9 8.9 - 10.3 mg/dL   Total Protein 7.8 6.5 - 8.1 g/dL  Albumin 4.9 3.5 - 5.0 g/dL   AST 39 15 - 41 U/L   ALT 27 0 - 44 U/L   Alkaline Phosphatase 81 38 - 126 U/L   Total Bilirubin 1.6 (H) 0.3 - 1.2 mg/dL   GFR calc non Af Amer 17 (L) >60 mL/min   GFR calc Af Amer 20 (L) >60 mL/min   Anion gap 14 5 - 15    Comment: Performed at [facility], [address] [place], [place] [address]  Lipase, blood     Status: Abnormal   Collection Time: 04/13/18  4:31 PM  Result Value Ref Range   Lipase 54 (H) 11 - 51 U/L    Comment: Performed at [facility], [address] [place], [place] [address]  Urinalysis, Microscopic (reflex)     Status: Abnormal   Collection Time: 04/13/18  4:31 PM  Result Value Ref Range   RBC / HPF 6-10 0 - 5 RBC/hpf   WBC, UA 21-50 0 - 5 WBC/hpf   Bacteria, UA FEW (A) NONE SEEN   Squamous Epithelial / LPF 0-5 0 - 5   WBC Clumps PRESENT    Mucus PRESENT    Hyaline Casts, UA PRESENT     Comment: Performed at [facility], [address] [place], [place] [address]  Basic metabolic panel     Status: Abnormal   Collection Time: 04/13/18  8:57 PM  Result Value Ref Range   Sodium 128 (L) 135 - 145 mmol/L   Potassium 3.7 3.5 - 5.1 mmol/L   Chloride 91 (L) 98 - 111 mmol/L   CO2 28 22 - 32 mmol/L   Glucose, Bld 127 (H) 70 - 99 mg/dL   BUN 48 (H) 8 - 23 mg/dL   Creatinine, Ser 2.06 (H) 0.44 - 1.00 mg/dL   Calcium 9.0 8.9 - 10.3 mg/dL   GFR calc non Af Amer 22 (L) >60 mL/min   GFR  calc Af Amer 26 (L) >60 mL/min   Anion gap 9 5 - 15    Comment: Performed at [facility], [address] [place], [place] [address]  Comprehensive metabolic panel     Status: Abnormal   Collection Time: 04/14/18  4:19 AM  Result Value Ref Range   Sodium 132 (L) 135 - 145 mmol/L   Potassium 3.8 3.5 - 5.1 mmol/L   Chloride 94 (L) 98 - 111 mmol/L   CO2 29 22 - 32 mmol/L   Glucose, Bld 117 (H) 70 - 99 mg/dL   BUN 38 (H) 8 - 23 mg/dL   Creatinine, Ser 1.42 (H) 0.44 - 1.00 mg/dL   Calcium 9.3 8.9 - 10.3 mg/dL   Total Protein 6.3 (L) 6.5 - 8.1 g/dL   Albumin 3.8 3.5 - 5.0 g/dL   AST 28 15 - 41 U/L   ALT 25 0 - 44 U/L   Alkaline Phosphatase 64 38 - 126 U/L   Total Bilirubin 1.3 (H) 0.3 - 1.2 mg/dL   GFR calc non Af Amer 35 (L) >60 mL/min   GFR calc Af Amer 41 (L) >60 mL/min   Anion gap 9 5 - 15    Comment: Performed at [facility], [place] [address] [place], [place] [address]  CBC WITH DIFFERENTIAL     Status: Abnormal   Collection Time: 04/14/18  4:19 AM  Result Value Ref Range   WBC 10.0 4.0 - 10.5 K/uL  RBC 4.18 3.87 - 5.11 MIL/uL   Hemoglobin 13.0 12.0 - 15.0 g/dL   HCT 40.2 36.0 - 46.0 %   MCV 96.2 80.0 - 100.0 fL   MCH 31.1 26.0 - 34.0 pg   MCHC 32.3 30.0 - 36.0 g/dL   RDW 12.6 11.5 - 15.5 %   Platelets 237 150 - 400 K/uL   nRBC 0.0 0.0 - 0.2 %   Neutrophils Relative % 80 %   Neutro Abs 7.8 (H) 1.7 - 7.7 K/uL   Lymphocytes Relative 9 %   Lymphs Abs 0.9 0.7 - 4.0 K/uL   Monocytes Relative 11 %   Monocytes Absolute 1.1 (H) 0.1 - 1.0 K/uL   Eosinophils Relative 0 %   Eosinophils Absolute 0.0 0.0 - 0.5 K/uL   Basophils Relative 0 %   Basophils Absolute 0.0 0.0 - 0.1 K/uL   Immature Granulocytes 0 %   Abs Immature Granulocytes 0.04 0.00 - 0.07 K/uL    Comment: Performed at [facility], [place] [name], [place], [place] [address]  Glucose, capillary     Status: None   Collection Time: 04/14/18  7:49 AM  Result Value Ref  Range   Glucose-Capillary 96 70 - 99 mg/dL   Ct Abdomen Pelvis Wo Contrast  Result Date: 04/13/2018 CLINICAL DATA:  Lower abdominal pain with nausea and vomiting for 2 days. EXAM: CT ABDOMEN AND PELVIS WITHOUT CONTRAST TECHNIQUE: Multidetector CT imaging of the abdomen and pelvis was performed following the standard protocol without IV contrast. COMPARISON:  Ultrasound kidneys 04/13/2018 FINDINGS: Lower chest: Atelectasis in the lung bases. 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 Coronary artery calcifications. Mild cardiac enlargement. Hepatobiliary: No focal liver lesions. Gallbladder is contracted but otherwise normal. No bile duct dilatation. Pancreas: Unremarkable. No pancreatic ductal dilatation or surrounding inflammatory changes. Spleen: Normal in size without focal abnormality. Adrenals/Urinary Tract: No adrenal gland nodules. Stone in the lower pole left kidney measuring 3 mm diameter. No hydronephrosis or hydroureter. Bladder is unremarkable. Stomach/Bowel: Market distention of fluid-filled stomach and small bowel. Distal small bowel are decompressed. Transition zone appears to be in the left lower quadrant. No cause is identified, suggesting probable adhesions. Appearance suggest high-grade obstruction. Scattered stool throughout the colon without colonic distention. Colonic diverticulosis without evidence of diverticulitis. Appendix is normal. Vascular/Lymphatic: Extensive aortic and vascular calcifications. Reproductive: Status post hysterectomy. No adnexal masses. Other: Small amount of free fluid in the upper abdomen, likely ascites or reactive fluid. No free air. Abdominal wall musculature appears intact. Musculoskeletal: Degenerative changes in the lumbar spine. Mild scoliosis convex towards the right, likely degenerative. IMPRESSION: 1. High-grade small bowel obstruction with transition zone in the left lower quadrant. No cause is identified, suggesting adhesions. 2. Small  amount of free fluid in the abdomen, likely ascites or reactive fluid. 3. Nonobstructing stone in the lower pole left kidney. 4. Colonic diverticulosis without evidence of diverticulitis. Aortic Atherosclerosis (ICD10-I70.0). Electronically Signed   By: Lucienne Capers M.D.   On: 04/13/2018 22:49   US Renal  Result Date: 04/13/2018 CLINICAL DATA:  Renal failure EXAM: RENAL / URINARY TRACT ULTRASOUND COMPLETE COMPARISON:  None. FINDINGS: Right Kidney: Renal measurements: 9.5 x 5.2 x 5.1 cm = volume: 132 mL . Echogenicity within normal limits. No mass or hydronephrosis visualized. Left Kidney: Renal measurements: 9.4 x 4.6 x 4.0 cm = volume: 91 mL. Echogenicity within normal limits. No mass or hydronephrosis visualized. Small echogenic foci within the left kidney, the largest 5 mm in the  lower pole, likely nonobstructing stones. Bladder: Decompressed, grossly unremarkable. IMPRESSION: No acute findings.  No hydronephrosis. Suspect small nonobstructing left renal stones. Electronically Signed   By: Rolm Baptise M.D.   On: 04/13/2018 19:06   Anti-infectives (From admission, onward)   Start     Dose/Rate Route Frequency Ordered Stop   04/14/18 1500  cefTRIAXone (ROCEPHIN) 1 g in sodium chloride 0.9 % 100 mL IVPB     1 g 200 mL/hr over 30 Minutes Intravenous Every 24 hours 04/14/18 0258     04/13/18 1730  cefTRIAXone (ROCEPHIN) 1 g in sodium chloride 0.9 % 100 mL IVPB     1 g 200 mL/hr over 30 Minutes Intravenous  Once 04/13/18 1726 04/13/18 1815        Assessment/Plan H/o breast cancer HTN AKI - Cr initially 2.58, trending down  ?UTI - on IV rocephin, Ucx pending  SBO - first SBO - only prior abdominal surgery was hysterectomy about 30 years ago - CT scan shows high-grade small bowel obstruction with transition zone in the left lower quadrant  ID - rocephin 2/27>> VTE - SCDs, ok for chemical DVT prophylaxis from surgical standpoint FEN - IVF, NPO/NGT to LIWS Foley - none Follow up -  TBD  Plan - Will start patient on small bowel protocol. Place NG tube. Give gastrograffin and will plan for delayed abdominal film. Will continue to follow.  Wellington Hampshire, [facility] Surgery 04/14/2018, 10:36 AM Pager: [phone] Mon-Thurs 7:00 am-4:30 pm Fri 7:00 am -11:30 AM Sat-Sun 7:00 am-11:30 am

## 2018-04-14 NOTE — Progress Notes (Signed)
PROGRESS NOTE    Cyani Kallstrom  UVO:[id number] DOB: [birth year] DOA: 04/13/2018 PCP: Linus Mako, NP    Brief Narrative:  78 y.o. female with history of hypertension, breast cancer in remission presented to the ER at [facility] with complaints of abdominal pain.  Patient has been having abdominal pain in the lower quadrant for the last 3 days and on the first day of the pain patient had at least 2-3 episodes of vomiting.  Subsequently which patient had at least one episode last 2 days.  Has not moved her bowels last 3 days.  And also has not had any flatus.  Denies any fever chills.  ED Course: In the ER CT scan shows high-grade small bowel obstruction with transition point.  Likely from medications per the CAT scan report.  UA shows possibility of UTI.  And creatinine was around 2.5 with normal creatinine to compare.  WBC count was 12.8.  Patient was placed on ceftriaxone for UTI and since patient had no further vomiting NG tube was not placed.  Dr. Harlow Asa on-call general surgery was consulted and patient admitted for further management.  Assessment & Plan:   Active Problems:   SBO (small bowel obstruction) (H[facility])   AKI (acute kidney injury) ([place])   Acute cystitis without hematuria   Essential hypertension  1. Small bowel obstruction 1. Suspected adhesion per CT report 2. General Surgery consulted, appreciate input. Plan for SBO protocol 3. NG tube recommended by Surgery 4. Continue supportive care  2. Acute kidney injury no old labs to compare.   1. Likely from vomiting and patient also using ACE inhibitor's.   2. Cr improved with IVF hydration.  3. Suspected UTI 1. Continue on ceftriaxone  2. follow cultures.  4. Hypertension we will keep patient on PRN IV hydralazine for now 1. Stable at present.  5. History of breast cancer in remission.  DVT prophylaxis: SCD's Code Status: Full Family Communication: Pt in room, family not at bedside Disposition  Plan: Uncertain at this time  Consultants:   General Surgery  Procedures:     Antimicrobials: Anti-infectives (From admission, onward)   Start     Dose/Rate Route Frequency Ordered Stop   04/14/18 1500  cefTRIAXone (ROCEPHIN) 1 g in sodium chloride 0.9 % 100 mL IVPB     1 g 200 mL/hr over 30 Minutes Intravenous Every 24 hours 04/14/18 0258     04/13/18 1730  cefTRIAXone (ROCEPHIN) 1 g in sodium chloride 0.9 % 100 mL IVPB     1 g 200 mL/hr over 30 Minutes Intravenous  Once 04/13/18 1726 04/13/18 1815       Subjective: No BM this AM, abd distended  Objective: Vitals:   04/13/18 2347 04/14/18 0125 04/14/18 0135 04/14/18 0619  BP: (!) 142/55 (!) 141/69  (!) 152/61  Pulse: 74 83  72  Resp: 16 16  16   Temp:  98.5 F (36.9 C)  98.8 F (37.1 C)  TempSrc:  Oral  Oral  SpO2: 94% 97%  94%  Weight:   51.5 kg   Height:   5' (1.524 m)     Intake/Output Summary (Last 24 hours) at 04/14/2018 1449 Last data filed at 04/14/2018 1200 Gross per 24 hour  Intake 2375.06 ml  Output 1000 ml  Net 1375.06 ml   Filed Weights   04/13/18 1605 04/14/18 0135  Weight: 49.9 kg 51.5 kg    Examination:  General exam: Appears calm and comfortable  Respiratory system: Clear to  auscultation. Respiratory effort normal. Cardiovascular system: S1 & S2 heard, RRR Gastrointestinal system: distended, decreased BS Central nervous system: Alert and oriented. No focal neurological deficits. Extremities: Symmetric 5 x 5 power. Skin: No rashes, lesions  Psychiatry: Judgement and insight appear normal. Mood & affect appropriate.   Data Reviewed: I have personally reviewed following labs and imaging studies  CBC: Recent Labs  Lab 04/13/18 1631 04/14/18 0419  WBC 12.8* 10.0  NEUTROABS 9.9* 7.8*  HGB 13.5 13.0  HCT 42.9 40.2  MCV 95.8 96.2  PLT 277 458   Basic Metabolic Panel: Recent Labs  Lab 04/13/18 1631 04/13/18 2057 04/14/18 0419  NA 129* 128* 132*  K 4.3 3.7 3.8  CL 88* 91* 94*    CO2 27 28 29   GLUCOSE 111* 127* 117*  BUN 54* 48* 38*  CREATININE 2.58* 2.06* 1.42*  CALCIUM 9.9 9.0 9.3   GFR: Estimated Creatinine Clearance: 23.1 mL/min (A) (by C-G formula based on SCr of 1.42 mg/dL (H)). Liver Function Tests: Recent Labs  Lab 04/13/18 1631 04/14/18 0419  AST 39 28  ALT 27 25  ALKPHOS 81 64  BILITOT 1.6* 1.3*  PROT 7.8 6.3*  ALBUMIN 4.9 3.8   Recent Labs  Lab 04/13/18 1631  LIPASE 54*   No results for input(s): AMMONIA in the last 168 hours. Coagulation Profile: No results for input(s): INR, PROTIME in the last 168 hours. Cardiac Enzymes: No results for input(s): CKTOTAL, CKMB, CKMBINDEX, TROPONINI in the last 168 hours. BNP (last 3 results) No results for input(s): PROBNP in the last 8760 hours. HbA1C: No results for input(s): HGBA1C in the last 72 hours. CBG: Recent Labs  Lab 04/14/18 0749  GLUCAP 96   Lipid Profile: No results for input(s): CHOL, HDL, LDLCALC, TRIG, CHOLHDL, LDLDIRECT in the last 72 hours. Thyroid Function Tests: No results for input(s): TSH, T4TOTAL, FREET4, T3FREE, THYROIDAB in the last 72 hours. Anemia Panel: No results for input(s): VITAMINB12, FOLATE, FERRITIN, TIBC, IRON, RETICCTPCT in the last 72 hours. Sepsis Labs: No results for input(s): PROCALCITON, LATICACIDVEN in the last 168 hours.  No results found for this or any previous visit (from the past 240 hour(s)).   Radiology Studies: Ct Abdomen Pelvis Wo Contrast  Result Date: 04/13/2018 CLINICAL DATA:  Lower abdominal pain with nausea and vomiting for 2 days. EXAM: CT ABDOMEN AND PELVIS WITHOUT CONTRAST TECHNIQUE: Multidetector CT imaging of the abdomen and pelvis was performed following the standard protocol without IV contrast. COMPARISON:  Ultrasound kidneys 04/13/2018 FINDINGS: Lower chest: Atelectasis in the lung bases. 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 Coronary artery calcifications. Mild cardiac enlargement. Hepatobiliary: No  focal liver lesions. Gallbladder is contracted but otherwise normal. No bile duct dilatation. Pancreas: Unremarkable. No pancreatic ductal dilatation or surrounding inflammatory changes. Spleen: Normal in size without focal abnormality. Adrenals/Urinary Tract: No adrenal gland nodules. Stone in the lower pole left kidney measuring 3 mm diameter. No hydronephrosis or hydroureter. Bladder is unremarkable. Stomach/Bowel: Market distention of fluid-filled stomach and small bowel. Distal small bowel are decompressed. Transition zone appears to be in the left lower quadrant. No cause is identified, suggesting probable adhesions. Appearance suggest high-grade obstruction. Scattered stool throughout the colon without colonic distention. Colonic diverticulosis without evidence of diverticulitis. Appendix is normal. Vascular/Lymphatic: Extensive aortic and vascular calcifications. Reproductive: Status post hysterectomy. No adnexal masses. Other: Small amount of free fluid in the upper abdomen, likely ascites or reactive fluid. No free air. Abdominal wall musculature appears intact. Musculoskeletal: Degenerative changes in  the lumbar spine. Mild scoliosis convex towards the right, likely degenerative. IMPRESSION: 1. High-grade small bowel obstruction with transition zone in the left lower quadrant. No cause is identified, suggesting adhesions. 2. Small amount of free fluid in the abdomen, likely ascites or reactive fluid. 3. Nonobstructing stone in the lower pole left kidney. 4. Colonic diverticulosis without evidence of diverticulitis. Aortic Atherosclerosis (ICD10-I70.0). Electronically Signed   By: Lucienne Capers M.D.   On: 04/13/2018 22:49   US Renal  Result Date: 04/13/2018 CLINICAL DATA:  Renal failure EXAM: RENAL / URINARY TRACT ULTRASOUND COMPLETE COMPARISON:  None. FINDINGS: Right Kidney: Renal measurements: 9.5 x 5.2 x 5.1 cm = volume: 132 mL . Echogenicity within normal limits. No mass or hydronephrosis  visualized. Left Kidney: Renal measurements: 9.4 x 4.6 x 4.0 cm = volume: 91 mL. Echogenicity within normal limits. No mass or hydronephrosis visualized. Small echogenic foci within the left kidney, the largest 5 mm in the lower pole, likely nonobstructing stones. Bladder: Decompressed, grossly unremarkable. IMPRESSION: No acute findings.  No hydronephrosis. Suspect small nonobstructing left renal stones. Electronically Signed   By: Rolm Baptise M.D.   On: 04/13/2018 19:06    Scheduled Meds: . diatrizoate meglumine-sodium  90 mL Per NG tube Once   Continuous Infusions: . cefTRIAXone (ROCEPHIN)  IV    . lactated ringers 100 mL/hr at 04/14/18 0322     LOS: 1 day   Marylu Lund, MD [facility] Pager On Amion  If 7PM-7AM, please contact night-coverage 04/14/2018, 2:49 PM

## 2018-04-14 NOTE — ED Notes (Signed)
Pt left via [facility] after ambulating to restroom without difficulty.  pts daughter made aware of pt assignment.

## 2018-04-14 NOTE — Progress Notes (Signed)
Attempted to pass NG tube x 2 without success, paged PA, awaiting return call

## 2018-04-15 ENCOUNTER — Inpatient Hospital Stay (HOSPITAL_COMMUNITY): Payer: Medicare Other

## 2018-04-15 LAB — BASIC METABOLIC PANEL
Anion gap: 11 (ref 5–15)
BUN: 33 mg/dL — ABNORMAL HIGH (ref 8–23)
CO2: 26 mmol/L (ref 22–32)
Calcium: 9 mg/dL (ref 8.9–10.3)
Chloride: 98 mmol/L (ref 98–111)
Creatinine, Ser: 1.15 mg/dL — ABNORMAL HIGH (ref 0.44–1.00)
GFR calc Af Amer: 52 mL/min — ABNORMAL LOW (ref >60)
GFR calc non Af Amer: 45 mL/min — ABNORMAL LOW (ref >60)
Glucose, Bld: 76 mg/dL (ref 70–99)
Potassium: 4 mmol/L (ref 3.5–5.1)
SODIUM: 135 mmol/L (ref 135–145)

## 2018-04-15 LAB — URINE CULTURE: Culture: NO GROWTH

## 2018-04-15 LAB — GLUCOSE, CAPILLARY
Glucose-Capillary: 132 mg/dL — ABNORMAL HIGH (ref 70–99)
Glucose-Capillary: 59 mg/dL — ABNORMAL LOW (ref 70–99)
Glucose-Capillary: 72 mg/dL (ref 70–99)
Glucose-Capillary: 79 mg/dL (ref 70–99)
Glucose-Capillary: 82 mg/dL (ref 70–99)

## 2018-04-15 MED ORDER — PHENOL 1.4 % MT LIQD
1.0000 | OROMUCOSAL | Status: DC | PRN
  Administered 2018-04-15: 1 via OROMUCOSAL
  Filled 2018-04-15: qty 177

## 2018-04-15 MED ORDER — DEXTROSE-NACL 5-0.9 % IV SOLN
INTRAVENOUS | Status: DC
  Administered 2018-04-15 – 2018-04-16 (×2): via INTRAVENOUS

## 2018-04-15 MED ORDER — DEXTROSE 50 % IV SOLN
12.5000 g | Freq: Once | INTRAVENOUS | Status: AC
  Administered 2018-04-15: 12.5 g via INTRAVENOUS
  Filled 2018-04-15: qty 50

## 2018-04-15 NOTE — Progress Notes (Signed)
Pt blood sugar 59. Pt NPO with IV access, will give 12.5 gm of D5 per protocol and recheck blood sugar in 15 min. Did not use order set, pt is not a diabetic, pt is here for SBO with NG tube. Will continue to monitor.

## 2018-04-15 NOTE — Progress Notes (Signed)
SBO  Subjective: Pt feeling better.  Passing some flatus this AM.  AXR shows contrast in R colon  Objective: Vital signs in last 24 hours: Temp:  [98.4 F (36.9 C)-99.1 F (37.3 C)] 99.1 F (37.3 C) (02/29 0623) Pulse Rate:  [69-86] 69 (02/29 0623) Resp:  [15-20] 15 (02/29 0623) BP: (145-180)/(60-75) 145/60 (02/29 0623) SpO2:  [92 %-95 %] 95 % (02/29 0623) Last BM Date: 04/11/18  Intake/Output from previous day: 02/28 0701 - 02/29 0700 In: 2230.3 [I.V.:2130.3; IV Piggyback:100] Out: 1600 [Urine:950; Emesis/NG output:650] Intake/Output this shift: No intake/output data recorded.  General appearance: alert and cooperative GI: soft, less distended  Lab Results:  Results for orders placed or performed during the hospital encounter of 04/13/18 (from the past 24 hour(s))  Glucose, capillary     Status: None   Collection Time: 04/14/18  4:27 PM  Result Value Ref Range   Glucose-Capillary 72 70 - 99 mg/dL  Glucose, capillary     Status: None   Collection Time: 04/15/18 12:49 AM  Result Value Ref Range   Glucose-Capillary 79 70 - 99 mg/dL  Basic metabolic panel     Status: Abnormal   Collection Time: 04/15/18  4:42 AM  Result Value Ref Range   Sodium 135 135 - 145 mmol/L   Potassium 4.0 3.5 - 5.1 mmol/L   Chloride 98 98 - 111 mmol/L   CO2 26 22 - 32 mmol/L   Glucose, Bld 76 70 - 99 mg/dL   BUN 33 (H) 8 - 23 mg/dL   Creatinine, Ser 1.15 (H) 0.44 - 1.00 mg/dL   Calcium 9.0 8.9 - 10.3 mg/dL   GFR calc non Af Amer 45 (L) >60 mL/min   GFR calc Af Amer 52 (L) >60 mL/min   Anion gap 11 5 - 15  Glucose, capillary     Status: Abnormal   Collection Time: 04/15/18  8:03 AM  Result Value Ref Range   Glucose-Capillary 59 (L) 70 - 99 mg/dL   Comment 1 Notify RN   Glucose, capillary     Status: Abnormal   Collection Time: 04/15/18  9:06 AM  Result Value Ref Range   Glucose-Capillary 132 (H) 70 - 99 mg/dL   Comment 1 Notify RN      Studies/Results Radiology     MEDS,  Scheduled    Assessment: pSBO  Plan: Seems to be resolving.  Will cont NG and get AXR this afternoon.  Await bowel function.      LOS: 2 days    Rosario Adie, [facility], [place] [phone]   04/15/2018 10:31 AM

## 2018-04-15 NOTE — Progress Notes (Signed)
PROGRESS NOTE    Chelsea Petersen  CZY:[id number] DOB: Oct 12, 1940 DOA: 04/13/2018 PCP: Linus Mako, NP    Brief Narrative:  78 y.o. female with history of hypertension, breast cancer in remission presented to the ER at [facility] with complaints of abdominal pain.  Patient has been having abdominal pain in the lower quadrant for the last 3 days and on the first day of the pain patient had at least 2-3 episodes of vomiting.  Subsequently which patient had at least one episode last 2 days.  Has not moved her bowels last 3 days.  And also has not had any flatus.  Denies any fever chills.  ED Course: In the ER CT scan shows high-grade small bowel obstruction with transition point.  Likely from medications per the CAT scan report.  UA shows possibility of UTI.  And creatinine was around 2.5 with normal creatinine to compare.  WBC count was 12.8.  Patient was placed on ceftriaxone for UTI and since patient had no further vomiting NG tube was not placed.  Dr. Harlow Asa on-call general surgery was consulted and patient admitted for further management.  Assessment & Plan:   Active Problems:   SBO (small bowel obstruction) (HCC)   AKI (acute kidney injury) ([place])   Acute cystitis without hematuria   Essential hypertension  1. Small bowel obstruction 1. Suspected adhesion per CT report 2. General Surgery consulted, appreciate input. Plan for SBO protocol 3. NG tube recommended by Surgery, wean NG per Surgery 4. Continue supportive care. Patient reports some flatus this AM  2. Acute kidney injury no old labs to compare.   1. Likely from vomiting and patient also using ACE inhibitor's.   2. Cr improved with IVF hydration.  3. Suspected UTI 1. Continue on ceftriaxone  2. Urine cultures pending  4. Hypertension we will keep patient on PRN IV hydralazine for now 1. Stable at present.  5. History of breast cancer in remission. 6. Hypoglycemia 1. Likely secondary to being  NPO 2. Will continue pt on D5 fluids while NPO  DVT prophylaxis: SCD's Code Status: Full Family Communication: Pt in room, family not at bedside Disposition Plan: Uncertain at this time  Consultants:   General Surgery  Procedures:     Antimicrobials: Anti-infectives (From admission, onward)   Start     Dose/Rate Route Frequency Ordered Stop   04/14/18 1500  cefTRIAXone (ROCEPHIN) 1 g in sodium chloride 0.9 % 100 mL IVPB     1 g 200 mL/hr over 30 Minutes Intravenous Every 24 hours 04/14/18 0258     04/13/18 1730  cefTRIAXone (ROCEPHIN) 1 g in sodium chloride 0.9 % 100 mL IVPB     1 g 200 mL/hr over 30 Minutes Intravenous  Once 04/13/18 1726 04/13/18 1815      Subjective: Reports some flatus this AM  Objective: Vitals:   04/14/18 2023 04/14/18 2155 04/15/18 0623 04/15/18 1401  BP: (!) 180/75 (!) 160/62 (!) 145/60 (!) 149/57  Pulse: 86 73 69 66  Resp: 20  15 20   Temp: 98.4 F (36.9 C)  99.1 F (37.3 C) 98.1 F (36.7 C)  TempSrc: Oral  Oral Oral  SpO2: 92%  95% 96%  Weight:      Height:        Intake/Output Summary (Last 24 hours) at 04/15/2018 1415 Last data filed at 04/15/2018 1400 Gross per 24 hour  Intake 2468.15 ml  Output 1952 ml  Net 516.15 ml   [employer]  04/13/18 1605 04/14/18 0135  Weight: 49.9 kg 51.5 kg    Examination: General exam: Awake, laying in bed, in nad Respiratory system: Normal respiratory effort, no wheezing Cardiovascular system: regular rate, s1, s2 Gastrointestinal system: Soft, mildly distended, decreased BS Central nervous system: CN2-12 grossly intact, strength intact Extremities: Perfused, no clubbing Skin: Normal skin turgor, no notable skin lesions seen Psychiatry: Mood normal // no visual hallucinations   Data Reviewed: I have personally reviewed following labs and imaging studies  CBC: Recent Labs  Lab 04/13/18 1631 04/14/18 0419  WBC 12.8* 10.0  NEUTROABS 9.9* 7.8*  HGB 13.5 13.0  HCT 42.9 40.2  MCV 95.8  96.2  PLT 277 962   Basic Metabolic Panel: Recent Labs  Lab 04/13/18 1631 04/13/18 2057 04/14/18 0419 04/15/18 0442  NA 129* 128* 132* 135  K 4.3 3.7 3.8 4.0  CL 88* 91* 94* 98  CO2 27 28 29 26   GLUCOSE 111* 127* 117* 76  BUN 54* 48* 38* 33*  CREATININE 2.58* 2.06* 1.42* 1.15*  CALCIUM 9.9 9.0 9.3 9.0   GFR: Estimated Creatinine Clearance: 28.5 mL/min (A) (by C-G formula based on SCr of 1.15 mg/dL (H)). Liver Function Tests: Recent Labs  Lab 04/13/18 1631 04/14/18 0419  AST 39 28  ALT 27 25  ALKPHOS 81 64  BILITOT 1.6* 1.3*  PROT 7.8 6.3*  ALBUMIN 4.9 3.8   Recent Labs  Lab 04/13/18 1631  LIPASE 54*   No results for input(s): AMMONIA in the last 168 hours. Coagulation Profile: No results for input(s): INR, PROTIME in the last 168 hours. Cardiac Enzymes: No results for input(s): CKTOTAL, CKMB, CKMBINDEX, TROPONINI in the last 168 hours. BNP (last 3 results) No results for input(s): PROBNP in the last 8760 hours. HbA1C: No results for input(s): HGBA1C in the last 72 hours. CBG: Recent Labs  Lab 04/14/18 0749 04/14/18 1627 04/15/18 0049 04/15/18 0803 04/15/18 0906  GLUCAP 96 72 79 59* 132*   Lipid Profile: No results for input(s): CHOL, HDL, LDLCALC, TRIG, CHOLHDL, LDLDIRECT in the last 72 hours. Thyroid Function Tests: No results for input(s): TSH, T4TOTAL, FREET4, T3FREE, THYROIDAB in the last 72 hours. Anemia Panel: No results for input(s): VITAMINB12, FOLATE, FERRITIN, TIBC, IRON, RETICCTPCT in the last 72 hours. Sepsis Labs: No results for input(s): PROCALCITON, LATICACIDVEN in the last 168 hours.  Recent Results (from the past 240 hour(s))  Urine culture     Status: None   Collection Time: 04/13/18  4:31 PM  Result Value Ref Range Status   Specimen Description   Final    URINE, RANDOM Performed at [facility], [address] [place], [place] [address]    Special Requests   Final    NONE Performed at [facility],  [address] [place], [place] [address]    Culture   Final    NO GROWTH Performed at [facility], [place] [address], [place], [place] [address]    Report Status 04/15/2018 FINAL  Final     Radiology Studies: Ct Abdomen Pelvis Wo Contrast  Result Date: 04/13/2018 CLINICAL DATA:  Lower abdominal pain with nausea and vomiting for 2 days. EXAM: CT ABDOMEN AND PELVIS WITHOUT CONTRAST TECHNIQUE: Multidetector CT imaging of the abdomen and pelvis was performed following the standard protocol without IV contrast. COMPARISON:  Ultrasound kidneys 04/13/2018 FINDINGS: Lower chest: Atelectasis in the lung bases. 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 Coronary artery calcifications. Mild cardiac enlargement.  Hepatobiliary: No focal liver lesions. Gallbladder is contracted but otherwise normal. No bile duct dilatation. Pancreas: Unremarkable. No pancreatic ductal dilatation or surrounding inflammatory changes. Spleen: Normal in size without focal abnormality. Adrenals/Urinary Tract: No adrenal gland nodules. Stone in the lower pole left kidney measuring 3 mm diameter. No hydronephrosis or hydroureter. Bladder is unremarkable. Stomach/Bowel: Market distention of fluid-filled stomach and small bowel. Distal small bowel are decompressed. Transition zone appears to be in the left lower quadrant. No cause is identified, suggesting probable adhesions. Appearance suggest high-grade obstruction. Scattered stool throughout the colon without colonic distention. Colonic diverticulosis without evidence of diverticulitis. Appendix is normal. Vascular/Lymphatic: Extensive aortic and vascular calcifications. Reproductive: Status post hysterectomy. No adnexal masses. Other: Small amount of free fluid in the upper abdomen, likely ascites or reactive fluid. No free air. Abdominal wall musculature appears intact. Musculoskeletal: Degenerative changes in the lumbar spine. Mild scoliosis convex towards the  right, likely degenerative. IMPRESSION: 1. High-grade small bowel obstruction with transition zone in the left lower quadrant. No cause is identified, suggesting adhesions. 2. Small amount of free fluid in the abdomen, likely ascites or reactive fluid. 3. Nonobstructing stone in the lower pole left kidney. 4. Colonic diverticulosis without evidence of diverticulitis. Aortic Atherosclerosis (ICD10-I70.0). Electronically Signed   By: Lucienne Capers M.D.   On: 04/13/2018 22:49   US Renal  Result Date: 04/13/2018 CLINICAL DATA:  Renal failure EXAM: RENAL / URINARY TRACT ULTRASOUND COMPLETE COMPARISON:  None. FINDINGS: Right Kidney: Renal measurements: 9.5 x 5.2 x 5.1 cm = volume: 132 mL . Echogenicity within normal limits. No mass or hydronephrosis visualized. Left Kidney: Renal measurements: 9.4 x 4.6 x 4.0 cm = volume: 91 mL. Echogenicity within normal limits. No mass or hydronephrosis visualized. Small echogenic foci within the left kidney, the largest 5 mm in the lower pole, likely nonobstructing stones. Bladder: Decompressed, grossly unremarkable. IMPRESSION: No acute findings.  No hydronephrosis. Suspect small nonobstructing left renal stones. Electronically Signed   By: Rolm Baptise M.D.   On: 04/13/2018 19:06   Dg Abd Portable 1v-small Bowel Obstruction Protocol-initial, 8 Hr Delay  Result Date: 04/15/2018 CLINICAL DATA:  Small bowel obstruction. 8 hour delay. EXAM: PORTABLE ABDOMEN - 1 VIEW COMPARISON:  04/14/2018 FINDINGS: Contrast material is demonstrated in the stomach, small bowel, and right colon. Presence of contrast material in the colon suggest partial small bowel obstruction. Small bowel remain dilated with evidence of wall thickening. Enteric tube in the left upper quadrant consistent with location in the upper stomach. Degenerative changes and scoliosis of the lumbar spine. IMPRESSION: Dilated small bowel with contrast material in the colon suggesting partial small bowel obstruction.  Electronically Signed   By: Lucienne Capers M.D.   On: 04/15/2018 02:34   Dg Addison Bailey G Tube Plc W/fl W/rad  Result Date: 04/14/2018 CLINICAL DATA:  Unable to place a nasogastric tube on the floor due to proximal obstruction. The tube is being placed for high-grade small bowel obstruction seen on a CT yesterday. EXAM: NASO G TUBE PLACEMENT WITH FL AND WITH RAD CONTRAST:  None used. FLUOROSCOPY TIME:  Fluoroscopy Time:  0 minutes 24 seconds Radiation Exposure Index (if provided by the fluoroscopic device): 3.0 mGy Number of Acquired Spot Images: 0 COMPARISON:  Abdomen and pelvis CT dated 04/13/2018 FINDINGS: Under fluoroscopic guidance, a nasogastric tube was passed through the patient's left nostril and into the stomach. The tip and side hole were left in the proximal stomach. The patient tolerated the procedure with no complications. The post placement  image demonstrates lumbar spine degenerative changes and scoliosis. No dilated bowel loops are visible on that image with a paucity of loops containing gas. IMPRESSION: Successful fluoroscopic guided nasogastric tube placement, as described above. Electronically Signed   By: Claudie Revering M.D.   On: 04/14/2018 15:52    Scheduled Meds:  Continuous Infusions: . cefTRIAXone (ROCEPHIN)  IV 1 g (04/15/18 1413)  . dextrose 5 % and 0.9% NaCl 75 mL/hr at 04/15/18 1046     LOS: 2 days   Marylu Lund, MD [facility] Pager On Amion  If 7PM-7AM, please contact night-coverage 04/15/2018, 2:15 PM

## 2018-04-16 ENCOUNTER — Inpatient Hospital Stay (HOSPITAL_COMMUNITY): Payer: Medicare Other

## 2018-04-16 LAB — GLUCOSE, CAPILLARY
Glucose-Capillary: 94 mg/dL (ref 70–99)
Glucose-Capillary: 139 mg/dL — ABNORMAL HIGH (ref 70–99)

## 2018-04-16 MED ORDER — SODIUM CHLORIDE 0.9 % IV SOLN
1.0000 g | INTRAVENOUS | Status: DC
  Administered 2018-04-16: 1 g via INTRAVENOUS
  Filled 2018-04-16: qty 10

## 2018-04-16 NOTE — Progress Notes (Signed)
PROGRESS NOTE    Chelsea Petersen  OZH:[id number] DOB: 01-23-1941 DOA: 04/13/2018 PCP: Linus Mako, NP    Brief Narrative:  78 y.o. female with history of hypertension, breast cancer in remission presented to the ER at [facility] with complaints of abdominal pain.  Patient has been having abdominal pain in the lower quadrant for the last 3 days and on the first day of the pain patient had at least 2-3 episodes of vomiting.  Subsequently which patient had at least one episode last 2 days.  Has not moved her bowels last 3 days.  And also has not had any flatus.  Denies any fever chills.  ED Course: In the ER CT scan shows high-grade small bowel obstruction with transition point.  Likely from medications per the CAT scan report.  UA shows possibility of UTI.  And creatinine was around 2.5 with normal creatinine to compare.  WBC count was 12.8.  Patient was placed on ceftriaxone for UTI and since patient had no further vomiting NG tube was not placed.  Dr. Harlow Asa on-call general surgery was consulted and patient admitted for further management.  Assessment & Plan:   Active Problems:   SBO (small bowel obstruction) (HCC)   AKI (acute kidney injury) ([place])   Acute cystitis without hematuria   Essential hypertension  1. Small bowel obstruction 1. Suspected adhesion per CT report 2. General Surgery following 3. Good bowel sounds and reports of BM noted 4. Anticipate weaning off NG soon and advancing diet, per Surgery  2. Acute kidney injury no old labs to compare.   1. Likely from vomiting and patient also using ACE inhibitor's.   2. Cr had improved with IVF hydration. Stable  3. Suspected UTI 1. Continue on ceftriaxone  2. Urine cultures pending at this time  4. Hypertension we will keep patient on PRN IV hydralazine for now 1. Remains stable at this time  5. History of breast cancer in remission. 6. Hypoglycemia 1. Likely secondary to being NPO 2. Improved with D5  fluids  DVT prophylaxis: SCD's Code Status: Full Family Communication: Pt in room, family not at bedside Disposition Plan: Uncertain at this time  Consultants:   General Surgery  Procedures:     Antimicrobials: Anti-infectives (From admission, onward)   Start     Dose/Rate Route Frequency Ordered Stop   04/14/18 1500  cefTRIAXone (ROCEPHIN) 1 g in sodium chloride 0.9 % 100 mL IVPB     1 g 200 mL/hr over 30 Minutes Intravenous Every 24 hours 04/14/18 0258     04/13/18 1730  cefTRIAXone (ROCEPHIN) 1 g in sodium chloride 0.9 % 100 mL IVPB     1 g 200 mL/hr over 30 Minutes Intravenous  Once 04/13/18 1726 04/13/18 1815      Subjective: Feels better. BM noted with good bowel sounds  Objective: Vitals:   04/15/18 0623 04/15/18 1401 04/15/18 2152 04/16/18 0529  BP: (!) 145/60 (!) 149/57 (!) 146/54 (!) 143/59  Pulse: 69 66 66 72  Resp: 15 20 16 18   Temp: 99.1 F (37.3 C) 98.1 F (36.7 C) 98.3 F (36.8 C) 98.6 F (37 C)  TempSrc: Oral Oral Oral Oral  SpO2: 95% 96% 95% 97%  Weight:      Height:        Intake/Output Summary (Last 24 hours) at 04/16/2018 1138 Last data filed at 04/16/2018 1000 Gross per 24 hour  Intake 1062.82 ml  Output 702 ml  Net 360.82 ml  [employer]   04/13/18 1605 04/14/18 0135  Weight: 49.9 kg 51.5 kg    Examination: General exam: Conversant, in no acute distress Respiratory system: normal chest rise, clear, no audible wheezing Cardiovascular system: regular rhythm, s1-s2 Gastrointestinal system: Nondistended, nontender, pos BS, NG in place Central nervous system: No seizures, no tremors Extremities: No cyanosis, no joint deformities Skin: No rashes, no pallor Psychiatry: Affect normal // no auditory hallucinations    Data Reviewed: I have personally reviewed following labs and imaging studies  CBC: Recent Labs  Lab 04/13/18 1631 04/14/18 0419  WBC 12.8* 10.0  NEUTROABS 9.9* 7.8*  HGB 13.5 13.0  HCT 42.9 40.2  MCV 95.8 96.2    PLT 277 892   Basic Metabolic Panel: Recent Labs  Lab 04/13/18 1631 04/13/18 2057 04/14/18 0419 04/15/18 0442  NA 129* 128* 132* 135  K 4.3 3.7 3.8 4.0  CL 88* 91* 94* 98  CO2 27 28 29 26   GLUCOSE 111* 127* 117* 76  BUN 54* 48* 38* 33*  CREATININE 2.58* 2.06* 1.42* 1.15*  CALCIUM 9.9 9.0 9.3 9.0   GFR: Estimated Creatinine Clearance: 28.5 mL/min (A) (by C-G formula based on SCr of 1.15 mg/dL (H)). Liver Function Tests: Recent Labs  Lab 04/13/18 1631 04/14/18 0419  AST 39 28  ALT 27 25  ALKPHOS 81 64  BILITOT 1.6* 1.3*  PROT 7.8 6.3*  ALBUMIN 4.9 3.8   Recent Labs  Lab 04/13/18 1631  LIPASE 54*   No results for input(s): AMMONIA in the last 168 hours. Coagulation Profile: No results for input(s): INR, PROTIME in the last 168 hours. Cardiac Enzymes: No results for input(s): CKTOTAL, CKMB, CKMBINDEX, TROPONINI in the last 168 hours. BNP (last 3 results) No results for input(s): PROBNP in the last 8760 hours. HbA1C: No results for input(s): HGBA1C in the last 72 hours. CBG: Recent Labs  Lab 04/15/18 0803 04/15/18 0906 04/15/18 1709 04/15/18 2332 04/16/18 0755  GLUCAP 59* 132* 82 72 94   Lipid Profile: No results for input(s): CHOL, HDL, LDLCALC, TRIG, CHOLHDL, LDLDIRECT in the last 72 hours. Thyroid Function Tests: No results for input(s): TSH, T4TOTAL, FREET4, T3FREE, THYROIDAB in the last 72 hours. Anemia Panel: No results for input(s): VITAMINB12, FOLATE, FERRITIN, TIBC, IRON, RETICCTPCT in the last 72 hours. Sepsis Labs: No results for input(s): PROCALCITON, LATICACIDVEN in the last 168 hours.  Recent Results (from the past 240 hour(s))  Urine culture     Status: None   Collection Time: 04/13/18  4:31 PM  Result Value Ref Range Status   Specimen Description   Final    URINE, RANDOM Performed at [facility], [address] [place], [place] [address]    Special Requests   Final    NONE Performed at [facility], [address] [place], [place] [address]    Culture   Final    NO GROWTH Performed at [facility], [place] [address] [place], [place] [address]    Report Status 04/15/2018 FINAL  Final     Radiology Studies: Dg Abd Portable 1v  Result Date: 04/16/2018 CLINICAL DATA:  Small-bowel obstruction. EXAM: PORTABLE ABDOMEN - 1 VIEW COMPARISON:  04/15/2018; abdominal CT-04/13/2018 FINDINGS: Persistent though improved mild distension of several patulous loops of small bowel with index loop of small bowel in the left mid hemiabdomen measuring 2.5 cm in diameter, previously, 3.7 cm. This finding is associated with potential bowel wall thickening. Enteric contrast remains within the  colon, in a similar location compared to the 04/15/2018 examination. Enteric tube tip and side port again project over the expected location of the gastric fundus. Nondiagnostic evaluation for pneumoperitoneum secondary to supine positioning and exclusion of the lower thorax. No pneumatosis or portal venous gas. Degenerative change of the lower lumbar spine, incompletely evaluated. IMPRESSION: Findings most suggestive of slightly improved though persistent small-bowel obstruction with slight reduction of gaseous distention of the small bowel though with persistent suspected small bowel wall thickening and unchanged location of ingested enteric contrast within the colon. Electronically Signed   By: Sandi Mariscal M.D.   On: 04/16/2018 06:29   Dg Abd Portable 1v  Result Date: 04/15/2018 CLINICAL DATA:  8 hour port abdomen EXAM: PORTABLE ABDOMEN - 1 VIEW COMPARISON:  04/15/2018 FINDINGS: Nasogastric tube is in place. There is residual contrast in the stomach and large bowel loops. There is persistent dilatation of small bowel loops, partially obscured by overlying contrast. The dilatation of small bowel appears slightly improved compared with most recent exam. Contrast is identified within the rectum, excluding complete obstruction.  IMPRESSION: Suspect slight improvement in the partial small bowel obstruction. There is persistent dilatation of small bowel loops partially obscured by the contrast. Electronically Signed   By: Nolon Nations M.D.   On: 04/15/2018 17:59   Dg Abd Portable 1v-small Bowel Obstruction Protocol-initial, 8 Hr Delay  Result Date: 04/15/2018 CLINICAL DATA:  Small bowel obstruction. 8 hour delay. EXAM: PORTABLE ABDOMEN - 1 VIEW COMPARISON:  04/14/2018 FINDINGS: Contrast material is demonstrated in the stomach, small bowel, and right colon. Presence of contrast material in the colon suggest partial small bowel obstruction. Small bowel remain dilated with evidence of wall thickening. Enteric tube in the left upper quadrant consistent with location in the upper stomach. Degenerative changes and scoliosis of the lumbar spine. IMPRESSION: Dilated small bowel with contrast material in the colon suggesting partial small bowel obstruction. Electronically Signed   By: Lucienne Capers M.D.   On: 04/15/2018 02:34   Dg Addison Bailey G Tube Plc W/fl W/rad  Result Date: 04/14/2018 CLINICAL DATA:  Unable to place a nasogastric tube on the floor due to proximal obstruction. The tube is being placed for high-grade small bowel obstruction seen on a CT yesterday. EXAM: NASO G TUBE PLACEMENT WITH FL AND WITH RAD CONTRAST:  None used. FLUOROSCOPY TIME:  Fluoroscopy Time:  0 minutes 24 seconds Radiation Exposure Index (if provided by the fluoroscopic device): 3.0 mGy Number of Acquired Spot Images: 0 COMPARISON:  Abdomen and pelvis CT dated 04/13/2018 FINDINGS: Under fluoroscopic guidance, a nasogastric tube was passed through the patient's left nostril and into the stomach. The tip and side hole were left in the proximal stomach. The patient tolerated the procedure with no complications. The post placement image demonstrates lumbar spine degenerative changes and scoliosis. No dilated bowel loops are visible on that image with a paucity of  loops containing gas. IMPRESSION: Successful fluoroscopic guided nasogastric tube placement, as described above. Electronically Signed   By: Claudie Revering M.D.   On: 04/14/2018 15:52    Scheduled Meds:  Continuous Infusions: . cefTRIAXone (ROCEPHIN)  IV 1 g (04/15/18 1413)  . dextrose 5 % and 0.9% NaCl 75 mL/hr at 04/16/18 0600     LOS: 3 days   Marylu Lund, MD [facility] Pager On Amion  If 7PM-7AM, please contact night-coverage 04/16/2018, 11:38 AM

## 2018-04-16 NOTE — Progress Notes (Signed)
NG tube removed without difficulty. Pt tolerated well. Started on clear liquid diet per order

## 2018-04-17 LAB — GLUCOSE, CAPILLARY
Glucose-Capillary: 98 mg/dL (ref 70–99)
Glucose-Capillary: 106 mg/dL — ABNORMAL HIGH (ref 70–99)

## 2018-04-17 MED ORDER — SODIUM CHLORIDE 0.9 % IV SOLN
1.0000 g | INTRAVENOUS | Status: AC
  Administered 2018-04-17: 1 g via INTRAVENOUS
  Filled 2018-04-17: qty 1

## 2018-04-17 NOTE — Discharge Summary (Addendum)
Physician Discharge Summary  Chelsea Petersen XBW:[id number] DOB: 1940-12-18 DOA: 04/13/2018  PCP: [name] I, NP  Admit date: 04/13/2018 Discharge date: 04/17/2018  Admitted From: ALF Disposition:  ALF  Recommendations for Outpatient Follow-up:  1. Follow up with PCP in 1-2 weeks 2. Follow up with General Surgery as scheduled  Discharge Condition:Improved CODE STATUS:Full Diet recommendation: Soft, advance as tolerated   Brief/Interim Summary: 78 y.o.femalewithhistory of hypertension, breast cancer in remission presented to the ER at [facility] with complaints of abdominal pain. Patient has been having abdominal pain in the lower quadrant for the last 3 days and on the first day of the pain patient had at least 2-3 episodes of vomiting. Subsequently which patient had at least one episode last 2 days. Has not moved her bowels last 3 days. And also has not had any flatus. Denies any fever chills.  ED Course:In the ER CT scan shows high-grade small bowel obstruction with transition point. Likely from medications per the CAT scan report. UA shows possibility of UTI. And creatinine was around 2.5 with normal creatinine to compare. WBC count was 12.8. Patient was placed on ceftriaxone for UTI and since patient had no further vomiting NG tube was not placed. Dr. Harlow Asa on-call general surgery was consulted and patient admitted for further management.   Discharge Diagnoses:  Active Problems:   SBO (small bowel obstruction) (HCC)   AKI (acute kidney injury) (Chelsea Petersen)   Acute cystitis without hematuria   Essential hypertension  1. Small bowel obstruction 1. Suspected adhesion per CT report 2. General Surgery following 3. Good bowel sounds and reports of BM noted 4. Bowel decompression with NG tube this visit per SBO protocol 5. Bowel function returned and NG removed. Pt tolerated soft diet. Reports continued flatus a time of d/c. Per General Surgery, OK to d/c if  tolerates diet and if pt has bowel function  2. Acute kidney injury no old labs to compare.  1. Likely from vomiting and patient also using ACE inhibitor's.  2. Cr had improved with IVF hydration. Stable 3. Resume meds on discharge  3. Suspected UTI 1. Completed 5 day course of ceftriaxone  2. Urine cultures without growth  4. Hypertension we will keep patient on PRN IV hydralazine for now 1. Remains stable this visit 2. Resume home meds on d/c  5. History of breast cancer in remission.  6. Hypoglycemia 1. Likely secondary to being NPO 2. Improved with D5 fluids 3. Now tolerating diet  Discharge Instructions   Allergies as of 04/17/2018      Reactions   Other Other (See Comments)   "eye drops" (PVA)      Medication List    TAKE these medications   ACIDOPHILUS LACTOBACILLUS PO Take 1 capsule by mouth daily.   celecoxib 200 MG capsule Commonly known as:  CELEBREX Take 200 mg by mouth daily with breakfast.   DYMISTA 137-50 MCG/ACT Susp Generic drug:  Azelastine-Fluticasone Place 1 spray into both nostrils every evening.   lisinopril 10 MG tablet Commonly known as:  PRINIVIL,ZESTRIL Take 10 mg by mouth daily.   multivitamin capsule Take 1 capsule by mouth daily.   Vitamin D3 25 MCG (1000 UT) Caps Take 1,000 Units by mouth daily.      Follow-up Information    [name] I, NP. Schedule an appointment as soon as possible for a visit in 1 week(s).   Specialty:  Nurse Practitioner Contact information: [address] [place] [address] [phone]  Surgery, [place]. Schedule an appointment as soon as possible for a visit.   Specialty:  General Surgery Contact information: [address] [place] [address] [phone]          Allergies  Allergen Reactions  . Other Other (See Comments)    "eye drops" (PVA)    Consultations:  General Surgery  Procedures/Studies: Ct Abdomen Pelvis Wo  Contrast  Result Date: 04/13/2018 CLINICAL DATA:  Lower abdominal pain with nausea and vomiting for 2 days. EXAM: CT ABDOMEN AND PELVIS WITHOUT CONTRAST TECHNIQUE: Multidetector CT imaging of the abdomen and pelvis was performed following the standard protocol without IV contrast. COMPARISON:  Ultrasound kidneys 04/13/2018 FINDINGS: Lower chest: Atelectasis in the lung bases. 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 Coronary artery calcifications. Mild cardiac enlargement. Hepatobiliary: No focal liver lesions. Gallbladder is contracted but otherwise normal. No bile duct dilatation. Pancreas: Unremarkable. No pancreatic ductal dilatation or surrounding inflammatory changes. Spleen: Normal in size without focal abnormality. Adrenals/Urinary Tract: No adrenal gland nodules. Stone in the lower pole left kidney measuring 3 mm diameter. No hydronephrosis or hydroureter. Bladder is unremarkable. Stomach/Bowel: Market distention of fluid-filled stomach and small bowel. Distal small bowel are decompressed. Transition zone appears to be in the left lower quadrant. No cause is identified, suggesting probable adhesions. Appearance suggest high-grade obstruction. Scattered stool throughout the colon without colonic distention. Colonic diverticulosis without evidence of diverticulitis. Appendix is normal. Vascular/Lymphatic: Extensive aortic and vascular calcifications. Reproductive: Status post hysterectomy. No adnexal masses. Other: Small amount of free fluid in the upper abdomen, likely ascites or reactive fluid. No free air. Abdominal wall musculature appears intact. Musculoskeletal: Degenerative changes in the lumbar spine. Mild scoliosis convex towards the right, likely degenerative. IMPRESSION: 1. High-grade small bowel obstruction with transition zone in the left lower quadrant. No cause is identified, suggesting adhesions. 2. Small amount of free fluid in the abdomen, likely ascites or reactive fluid.  3. Nonobstructing stone in the lower pole left kidney. 4. Colonic diverticulosis without evidence of diverticulitis. Aortic Atherosclerosis (ICD10-I70.0). Electronically Signed   By: Lucienne Capers M.D.   On: 04/13/2018 22:49   US Renal  Result Date: 04/13/2018 CLINICAL DATA:  Renal failure EXAM: RENAL / URINARY TRACT ULTRASOUND COMPLETE COMPARISON:  None. FINDINGS: Right Kidney: Renal measurements: 9.5 x 5.2 x 5.1 cm = volume: 132 mL . Echogenicity within normal limits. No mass or hydronephrosis visualized. Left Kidney: Renal measurements: 9.4 x 4.6 x 4.0 cm = volume: 91 mL. Echogenicity within normal limits. No mass or hydronephrosis visualized. Small echogenic foci within the left kidney, the largest 5 mm in the lower pole, likely nonobstructing stones. Bladder: Decompressed, grossly unremarkable. IMPRESSION: No acute findings.  No hydronephrosis. Suspect small nonobstructing left renal stones. Electronically Signed   By: Rolm Baptise M.D.   On: 04/13/2018 19:06   Dg Abd Portable 1v  Result Date: 04/16/2018 CLINICAL DATA:  Small-bowel obstruction. EXAM: PORTABLE ABDOMEN - 1 VIEW COMPARISON:  04/15/2018; abdominal CT-04/13/2018 FINDINGS: Persistent though improved mild distension of several patulous loops of small bowel with index loop of small bowel in the left mid hemiabdomen measuring 2.5 cm in diameter, previously, 3.7 cm. This finding is associated with potential bowel wall thickening. Enteric contrast remains within the colon, in a similar location compared to the 04/15/2018 examination. Enteric tube tip and side port again project over the expected location of the gastric fundus. Nondiagnostic evaluation for pneumoperitoneum secondary to supine positioning and exclusion of the lower thorax.  No pneumatosis or portal venous gas. Degenerative change of the lower lumbar spine, incompletely evaluated. IMPRESSION: Findings most suggestive of slightly improved though persistent small-bowel obstruction  with slight reduction of gaseous distention of the small bowel though with persistent suspected small bowel wall thickening and unchanged location of ingested enteric contrast within the colon. Electronically Signed   By: Sandi Mariscal M.D.   On: 04/16/2018 06:29   Dg Abd Portable 1v  Result Date: 04/15/2018 CLINICAL DATA:  8 hour port abdomen EXAM: PORTABLE ABDOMEN - 1 VIEW COMPARISON:  04/15/2018 FINDINGS: Nasogastric tube is in place. There is residual contrast in the stomach and large bowel loops. There is persistent dilatation of small bowel loops, partially obscured by overlying contrast. The dilatation of small bowel appears slightly improved compared with most recent exam. Contrast is identified within the rectum, excluding complete obstruction. IMPRESSION: Suspect slight improvement in the partial small bowel obstruction. There is persistent dilatation of small bowel loops partially obscured by the contrast. Electronically Signed   By: Nolon Nations M.D.   On: 04/15/2018 17:59   Dg Abd Portable 1v-small Bowel Obstruction Protocol-initial, 8 Hr Delay  Result Date: 04/15/2018 CLINICAL DATA:  Small bowel obstruction. 8 hour delay. EXAM: PORTABLE ABDOMEN - 1 VIEW COMPARISON:  04/14/2018 FINDINGS: Contrast material is demonstrated in the stomach, small bowel, and right colon. Presence of contrast material in the colon suggest partial small bowel obstruction. Small bowel remain dilated with evidence of wall thickening. Enteric tube in the left upper quadrant consistent with location in the upper stomach. Degenerative changes and scoliosis of the lumbar spine. IMPRESSION: Dilated small bowel with contrast material in the colon suggesting partial small bowel obstruction. Electronically Signed   By: Lucienne Capers M.D.   On: 04/15/2018 02:34   Dg Addison Bailey G Tube Plc W/fl W/rad  Result Date: 04/14/2018 CLINICAL DATA:  Unable to place a nasogastric tube on the floor due to proximal obstruction. The tube is  being placed for high-grade small bowel obstruction seen on a CT yesterday. EXAM: NASO G TUBE PLACEMENT WITH FL AND WITH RAD CONTRAST:  None used. FLUOROSCOPY TIME:  Fluoroscopy Time:  0 minutes 24 seconds Radiation Exposure Index (if provided by the fluoroscopic device): 3.0 mGy Number of Acquired Spot Images: 0 COMPARISON:  Abdomen and pelvis CT dated 04/13/2018 FINDINGS: Under fluoroscopic guidance, a nasogastric tube was passed through the patient's left nostril and into the stomach. The tip and side hole were left in the proximal stomach. The patient tolerated the procedure with no complications. The post placement image demonstrates lumbar spine degenerative changes and scoliosis. No dilated bowel loops are visible on that image with a paucity of loops containing gas. IMPRESSION: Successful fluoroscopic guided nasogastric tube placement, as described above. Electronically Signed   By: Claudie Revering M.D.   On: 04/14/2018 15:52     Subjective: Eager to go home today  Discharge Exam: Vitals:   04/17/18 0600 04/17/18 1400  BP: (!) 147/69 (!) 181/77  Pulse: 68 66  Resp: 16 18  Temp: 99.1 F (37.3 C) 98.4 F (36.9 C)  SpO2: 95% 100%   Vitals:   04/16/18 1402 04/16/18 2100 04/17/18 0600 04/17/18 1400  BP: 136/68 (!) 151/71 (!) 147/69 (!) 181/77  Pulse: 65 73 68 66  Resp: 18 18 16 18   Temp: 98.3 F (36.8 C) 99 F (37.2 C) 99.1 F (37.3 C) 98.4 F (36.9 C)  TempSrc: Oral Oral Oral Oral  SpO2: 98% 99% 95% 100%  Weight:  Height:        General: Pt is alert, awake, not in acute distress Cardiovascular: RRR, S1/S2 +, no rubs, no gallops Respiratory: CTA bilaterally, no wheezing, no rhonchi Abdominal: Soft, NT, ND, bowel sounds + Extremities: no edema, no cyanosis   The results of significant diagnostics from this hospitalization (including imaging, microbiology, ancillary and laboratory) are listed below for reference.     Microbiology: Recent Results (from the past 240  hour(s))  Urine culture     Status: None   Collection Time: 04/13/18  4:31 PM  Result Value Ref Range Status   Specimen Description   Final    URINE, RANDOM Performed at [facility], [address] [place], [place] [address]    Special Requests   Final    NONE Performed at [facility], [address] [place], [place] [address]    Culture   Final    NO GROWTH Performed at [facility], [place] [address] [place], [place] [address]    Report Status 04/15/2018 FINAL  Final     Labs: BNP (last 3 results) No results for input(s): BNP in the last 8760 hours. Basic Metabolic Panel: Recent Labs  Lab 04/13/18 1631 04/13/18 2057 04/14/18 0419 04/15/18 0442  NA 129* 128* 132* 135  K 4.3 3.7 3.8 4.0  CL 88* 91* 94* 98  CO2 27 28 29 26   GLUCOSE 111* 127* 117* 76  BUN 54* 48* 38* 33*  CREATININE 2.58* 2.06* 1.42* 1.15*  CALCIUM 9.9 9.0 9.3 9.0   Liver Function Tests: Recent Labs  Lab 04/13/18 1631 04/14/18 0419  AST 39 28  ALT 27 25  ALKPHOS 81 64  BILITOT 1.6* 1.3*  PROT 7.8 6.3*  ALBUMIN 4.9 3.8   Recent Labs  Lab 04/13/18 1631  LIPASE 54*   No results for input(s): AMMONIA in the last 168 hours. CBC: Recent Labs  Lab 04/13/18 1631 04/14/18 0419  WBC 12.8* 10.0  NEUTROABS 9.9* 7.8*  HGB 13.5 13.0  HCT 42.9 40.2  MCV 95.8 96.2  PLT 277 237   Cardiac Enzymes: No results for input(s): CKTOTAL, CKMB, CKMBINDEX, TROPONINI in the last 168 hours. BNP: Invalid input(s): POCBNP CBG: Recent Labs  Lab 04/15/18 2332 04/16/18 0755 04/16/18 1606 04/17/18 0557 04/17/18 1608  GLUCAP 72 94 139* 98 106*   D-Dimer No results for input(s): DDIMER in the last 72 hours. Hgb A1c No results for input(s): HGBA1C in the last 72 hours. Lipid Profile No results for input(s): CHOL, HDL, LDLCALC, TRIG, CHOLHDL, LDLDIRECT in the last 72 hours. Thyroid function studies No results for input(s): TSH, T4TOTAL, T3FREE, THYROIDAB in the last 72  hours.  Invalid input(s): FREET3 Anemia work up No results for input(s): VITAMINB12, FOLATE, FERRITIN, TIBC, IRON, RETICCTPCT in the last 72 hours. Urinalysis    Component Value Date/Time   COLORURINE YELLOW 04/13/2018 1631   APPEARANCEUR HAZY (A) 04/13/2018 1631   LABSPEC >1.030 (H) 04/13/2018 1631   PHURINE 5.0 04/13/2018 1631   GLUCOSEU NEGATIVE 04/13/2018 1631   HGBUR TRACE (A) 04/13/2018 1631   BILIRUBINUR SMALL (A) 04/13/2018 1631   KETONESUR NEGATIVE 04/13/2018 1631   PROTEINUR NEGATIVE 04/13/2018 1631   NITRITE NEGATIVE 04/13/2018 1631   LEUKOCYTESUR TRACE (A) 04/13/2018 1631   Sepsis Labs Invalid input(s): PROCALCITONIN,  WBC,  LACTICIDVEN Microbiology Recent Results (from the past 240 hour(s))  Urine culture     Status: None   Collection Time: 04/13/18  4:31 PM  Result Value Ref Range Status   Specimen Description   Final    URINE, RANDOM Performed at [facility], [address] [place], [place] [address]    Special Requests   Final    NONE Performed at [facility], [address] [place], [place] [address]    Culture   Final    NO GROWTH Performed at [facility], [place] [address] [place], [place] [address]    Report Status 04/15/2018 FINAL  Final   Time spent: 30 min  SIGNED:   Marylu Lund, MD  [facility] 04/17/2018, 5:02 PM  If 7PM-7AM, please contact night-coverage

## 2018-04-17 NOTE — Progress Notes (Signed)
[facility] Progress Note     Subjective: CC-  Up in chair. Overall feeling better. Abdominal pain nearly resolved. Tolerating liquids. States that she does still get bloated and feel full with PO intake, but she has had no n/v. Passing some flatus. Small BM this morning.  Objective: Vital signs in last 24 hours: Temp:  [98.3 F (36.8 C)-99.1 F (37.3 C)] 99.1 F (37.3 C) (03/02 0600) Pulse Rate:  [65-73] 68 (03/02 0600) Resp:  [16-18] 16 (03/02 0600) BP: (136-151)/(68-71) 147/69 (03/02 0600) SpO2:  [95 %-99 %] 95 % (03/02 0600) Last BM Date: 04/17/18  Intake/Output from previous day: 03/01 0701 - 03/02 0700 In: 1708.7 [I.V.:1508.7; IV Piggyback:200] Out: 300 [Emesis/NG output:300] Intake/Output this shift: Total I/O In: 1460 [P.O.:360; I.V.:300; IV Piggyback:800] Out: 0   PE: Gen:  Alert, NAD, pleasant HEENT: EOM's intact, pupils equal and round Pulm:  effort normal Abd: Soft, mild distension, +BS, no HSM, nontender Ext: no BLE edema Psych: A&Ox3  Skin: no rashes noted, warm and dry  Lab Results:  No results for input(s): WBC, HGB, HCT, PLT in the last 72 hours. BMET Recent Labs    04/15/18 0442  NA 135  K 4.0  CL 98  CO2 26  GLUCOSE 76  BUN 33*  CREATININE 1.15*  CALCIUM 9.0   PT/INR No results for input(s): LABPROT, INR in the last 72 hours. CMP     Component Value Date/Time   NA 135 04/15/2018 0442   K 4.0 04/15/2018 0442   CL 98 04/15/2018 0442   CO2 26 04/15/2018 0442   GLUCOSE 76 04/15/2018 0442   BUN 33 (H) 04/15/2018 0442   CREATININE 1.15 (H) 04/15/2018 0442   CALCIUM 9.0 04/15/2018 0442   PROT 6.3 (L) 04/14/2018 0419   ALBUMIN 3.8 04/14/2018 0419   AST 28 04/14/2018 0419   ALT 25 04/14/2018 0419   ALKPHOS 64 04/14/2018 0419   BILITOT 1.3 (H) 04/14/2018 0419   GFRNONAA 45 (L) 04/15/2018 0442   GFRAA 52 (L) 04/15/2018 0442   Lipase     Component Value Date/Time   LIPASE 54 (H) 04/13/2018 1631        Studies/Results: Dg Abd Portable 1v  Result Date: 04/16/2018 CLINICAL DATA:  Small-bowel obstruction. EXAM: PORTABLE ABDOMEN - 1 VIEW COMPARISON:  04/15/2018; abdominal CT-04/13/2018 FINDINGS: Persistent though improved mild distension of several patulous loops of small bowel with index loop of small bowel in the left mid hemiabdomen measuring 2.5 cm in diameter, previously, 3.7 cm. This finding is associated with potential bowel wall thickening. Enteric contrast remains within the colon, in a similar location compared to the 04/15/2018 examination. Enteric tube tip and side port again project over the expected location of the gastric fundus. Nondiagnostic evaluation for pneumoperitoneum secondary to supine positioning and exclusion of the lower thorax. No pneumatosis or portal venous gas. Degenerative change of the lower lumbar spine, incompletely evaluated. IMPRESSION: Findings most suggestive of slightly improved though persistent small-bowel obstruction with slight reduction of gaseous distention of the small bowel though with persistent suspected small bowel wall thickening and unchanged location of ingested enteric contrast within the colon. Electronically Signed   By: Sandi Mariscal M.D.   On: 04/16/2018 06:29   Dg Abd Portable 1v  Result Date: 04/15/2018 CLINICAL DATA:  8 hour port abdomen EXAM: PORTABLE ABDOMEN - 1 VIEW COMPARISON:  04/15/2018 FINDINGS: Nasogastric tube is in place. There is residual contrast in the stomach and large bowel loops. There is persistent dilatation  of small bowel loops, partially obscured by overlying contrast. The dilatation of small bowel appears slightly improved compared with most recent exam. Contrast is identified within the rectum, excluding complete obstruction. IMPRESSION: Suspect slight improvement in the partial small bowel obstruction. There is persistent dilatation of small bowel loops partially obscured by the contrast. Electronically Signed   By:  Nolon Nations M.D.   On: 04/15/2018 17:59    Anti-infectives: Anti-infectives (From admission, onward)   Start     Dose/Rate Route Frequency Ordered Stop   04/17/18 1200  cefTRIAXone (ROCEPHIN) 1 g in sodium chloride 0.9 % 100 mL IVPB     1 g 200 mL/hr over 30 Minutes Intravenous Every 24 hours 04/17/18 0759 04/18/18 1159   04/16/18 2000  cefTRIAXone (ROCEPHIN) 1 g in sodium chloride 0.9 % 100 mL IVPB  Status:  Discontinued     1 g 200 mL/hr over 30 Minutes Intravenous Every 24 hours 04/16/18 1935 04/17/18 0759   04/14/18 1500  cefTRIAXone (ROCEPHIN) 1 g in sodium chloride 0.9 % 100 mL IVPB  Status:  Discontinued     1 g 200 mL/hr over 30 Minutes Intravenous Every 24 hours 04/14/18 0258 04/16/18 1935   04/13/18 1730  cefTRIAXone (ROCEPHIN) 1 g in sodium chloride 0.9 % 100 mL IVPB     1 g 200 mL/hr over 30 Minutes Intravenous  Once 04/13/18 1726 04/13/18 1815       Assessment/Plan H/o breast cancer HTN AKI - improving, Cr 1.15 (2/29)  SBO - first SBO - only prior abdominal surgery was hysterectomy about 30 years ago - CT scan 2/27 high-grade small bowel obstruction with transition zone in the left lower quadrant - delayed abdominal film showed contrast in colon  ID - rocephin 2/27>> VTE - SCDs, ok for chemical DVT prophylaxis from surgical standpoint FEN - soft diet Foley - none Follow up - TBD  Plan - Advance to soft diet. Continue mobilizing. If patient tolerates diet and continues to have bowel function she is stable for discharge from surgical standpoint.   LOS: 4 days    Wellington Hampshire , [facility] 04/17/2018, 12:41 PM Pager: [phone] Mon-Thurs 7:00 am-4:30 pm Fri 7:00 am -11:30 AM Sat-Sun 7:00 am-11:30 am

## 2018-04-17 NOTE — Progress Notes (Signed)
Patient given discharge instructions. No questions. Son present at bedside. NT will wheel walk patient to the front of the building per patient request

## 2018-04-17 NOTE — Progress Notes (Signed)
CSW received a call from RN stating pt is from [facility] and is ready for D/C.  CSW called the front desk and spoke to Bonneau at [facility] who voiced understanding pt is to return and is being transported by family.  RN updated.  Please reconsult if future social work needs arise.  CSW signing off, as social work intervention is no longer needed.  Alphonse Guild. Alexsus Papadopoulos, LCSW, LCAS, CSI Clinical Social Worker Ph: [phone]

## 2018-04-17 NOTE — Care Management Important Message (Signed)
Important Message  Patient Details  Name: Alexah Kivett MRN: [id number] Date of Birth: [birth year]   Medicare Important Message Given:  Yes    Kerin Salen 04/17/2018, 10:59 AMImportant Message  Patient Details  Name: Rima Blizzard MRN: [id number] Date of Birth: 08-May-1940   Medicare Important Message Given:  Yes    Kerin Salen 04/17/2018, 10:59 AM

## 2018-04-21 ENCOUNTER — Inpatient Hospital Stay (HOSPITAL_COMMUNITY): Payer: Medicare Other

## 2018-04-21 ENCOUNTER — Inpatient Hospital Stay (HOSPITAL_COMMUNITY)
Admission: EM | Admit: 2018-04-21 | Discharge: 2018-04-26 | DRG: 389 | Disposition: A | Discharge status: Home / Self Care | Payer: Medicare Other | Attending: Internal Medicine | Admitting: Internal Medicine

## 2018-04-21 ENCOUNTER — Encounter (HOSPITAL_COMMUNITY): Payer: Self-pay | Admitting: Emergency Medicine

## 2018-04-21 ENCOUNTER — Other Ambulatory Visit: Payer: Self-pay

## 2018-04-21 ENCOUNTER — Emergency Department (HOSPITAL_COMMUNITY): Payer: Medicare Other

## 2018-04-21 DIAGNOSIS — Z853 Personal history of malignant neoplasm of breast: Secondary | ICD-10-CM | POA: Diagnosis not present

## 2018-04-21 DIAGNOSIS — K5732 Diverticulitis of large intestine without perforation or abscess without bleeding: Secondary | ICD-10-CM | POA: Diagnosis present

## 2018-04-21 DIAGNOSIS — I700 Atherosclerosis of aorta: Secondary | ICD-10-CM | POA: Diagnosis present

## 2018-04-21 DIAGNOSIS — Z8249 Family history of ischemic heart disease and other diseases of the circulatory system: Secondary | ICD-10-CM | POA: Diagnosis not present

## 2018-04-21 DIAGNOSIS — M47816 Spondylosis without myelopathy or radiculopathy, lumbar region: Secondary | ICD-10-CM | POA: Diagnosis present

## 2018-04-21 DIAGNOSIS — G894 Chronic pain syndrome: Secondary | ICD-10-CM | POA: Diagnosis present

## 2018-04-21 DIAGNOSIS — K5651 Intestinal adhesions [bands], with partial obstruction: Secondary | ICD-10-CM | POA: Diagnosis present

## 2018-04-21 DIAGNOSIS — Z888 Allergy status to other drugs, medicaments and biological substances status: Secondary | ICD-10-CM | POA: Diagnosis not present

## 2018-04-21 DIAGNOSIS — E871 Hypo-osmolality and hyponatremia: Secondary | ICD-10-CM | POA: Diagnosis present

## 2018-04-21 DIAGNOSIS — Z79899 Other long term (current) drug therapy: Secondary | ICD-10-CM | POA: Diagnosis not present

## 2018-04-21 DIAGNOSIS — Z8542 Personal history of malignant neoplasm of other parts of uterus: Secondary | ICD-10-CM

## 2018-04-21 DIAGNOSIS — Z87442 Personal history of urinary calculi: Secondary | ICD-10-CM | POA: Diagnosis not present

## 2018-04-21 DIAGNOSIS — K219 Gastro-esophageal reflux disease without esophagitis: Secondary | ICD-10-CM | POA: Diagnosis present

## 2018-04-21 DIAGNOSIS — Z90710 Acquired absence of both cervix and uterus: Secondary | ICD-10-CM

## 2018-04-21 DIAGNOSIS — N200 Calculus of kidney: Secondary | ICD-10-CM | POA: Diagnosis present

## 2018-04-21 DIAGNOSIS — R740 Nonspecific elevation of levels of transaminase and lactic acid dehydrogenase [LDH]: Secondary | ICD-10-CM | POA: Diagnosis present

## 2018-04-21 DIAGNOSIS — R188 Other ascites: Secondary | ICD-10-CM | POA: Diagnosis present

## 2018-04-21 DIAGNOSIS — Z0189 Encounter for other specified special examinations: Secondary | ICD-10-CM

## 2018-04-21 DIAGNOSIS — K56609 Unspecified intestinal obstruction, unspecified as to partial versus complete obstruction: Secondary | ICD-10-CM | POA: Diagnosis present

## 2018-04-21 DIAGNOSIS — E876 Hypokalemia: Secondary | ICD-10-CM | POA: Diagnosis not present

## 2018-04-21 DIAGNOSIS — I10 Essential (primary) hypertension: Secondary | ICD-10-CM | POA: Diagnosis present

## 2018-04-21 DIAGNOSIS — R748 Abnormal levels of other serum enzymes: Secondary | ICD-10-CM | POA: Diagnosis present

## 2018-04-21 LAB — CBC WITH DIFFERENTIAL/PLATELET
Abs Immature Granulocytes: 0.04 10*3/uL (ref 0.00–0.07)
Basophils Absolute: 0 10*3/uL (ref 0.0–0.1)
Basophils Relative: 0 %
EOS PCT: 0 %
Eosinophils Absolute: 0 10*3/uL (ref 0.0–0.5)
HCT: 39.3 % (ref 36.0–46.0)
Hemoglobin: 12.2 g/dL (ref 12.0–15.0)
Immature Granulocytes: 0 %
Lymphocytes Relative: 14 %
Lymphs Abs: 1.3 10*3/uL (ref 0.7–4.0)
MCH: 30.7 pg (ref 26.0–34.0)
MCHC: 31 g/dL (ref 30.0–36.0)
MCV: 98.7 fL (ref 80.0–100.0)
Monocytes Absolute: 0.8 10*3/uL (ref 0.1–1.0)
Monocytes Relative: 9 %
Neutro Abs: 7.2 10*3/uL (ref 1.7–7.7)
Neutrophils Relative %: 77 %
Platelets: 333 10*3/uL (ref 150–400)
RBC: 3.98 MIL/uL (ref 3.87–5.11)
RDW: 12 % (ref 11.5–15.5)
WBC: 9.5 10*3/uL (ref 4.0–10.5)
nRBC: 0 % (ref 0.0–0.2)

## 2018-04-21 LAB — COMPREHENSIVE METABOLIC PANEL
ALK PHOS: 67 U/L (ref 38–126)
ALT: 70 U/L — ABNORMAL HIGH (ref 0–44)
AST: 51 U/L — ABNORMAL HIGH (ref 15–41)
Albumin: 4 g/dL (ref 3.5–5.0)
Anion gap: 9 (ref 5–15)
BUN: 13 mg/dL (ref 8–23)
CO2: 22 mmol/L (ref 22–32)
Calcium: 9.5 mg/dL (ref 8.9–10.3)
Chloride: 100 mmol/L (ref 98–111)
Creatinine, Ser: 0.99 mg/dL (ref 0.44–1.00)
GFR calc Af Amer: >60 mL/min (ref >60)
GFR calc non Af Amer: 54 mL/min — ABNORMAL LOW (ref >60)
Glucose, Bld: 100 mg/dL — ABNORMAL HIGH (ref 70–99)
Potassium: 4.2 mmol/L (ref 3.5–5.1)
SODIUM: 131 mmol/L — AB (ref 135–145)
TOTAL PROTEIN: 6.7 g/dL (ref 6.5–8.1)
Total Bilirubin: 1 mg/dL (ref 0.3–1.2)

## 2018-04-21 LAB — LIPASE, BLOOD: Lipase: 73 U/L — ABNORMAL HIGH (ref 11–51)

## 2018-04-21 LAB — I-STAT CREATININE, ED: Creatinine, Ser: 1 mg/dL (ref 0.44–1.00)

## 2018-04-21 MED ORDER — DIATRIZOATE MEGLUMINE & SODIUM 66-10 % PO SOLN
90.0000 mL | Freq: Once | ORAL | Status: AC
  Administered 2018-04-22: 90 mL via NASOGASTRIC
  Filled 2018-04-21: qty 90

## 2018-04-21 MED ORDER — IOPAMIDOL (ISOVUE-300) INJECTION 61%
100.0000 mL | Freq: Once | INTRAVENOUS | Status: AC | PRN
  Administered 2018-04-21: 80 mL via INTRAVENOUS

## 2018-04-21 MED ORDER — ONDANSETRON HCL 4 MG/2ML IJ SOLN
4.0000 mg | Freq: Once | INTRAMUSCULAR | Status: AC
  Administered 2018-04-21: 4 mg via INTRAVENOUS
  Filled 2018-04-21: qty 2

## 2018-04-21 MED ORDER — SODIUM CHLORIDE (PF) 0.9 % IJ SOLN
INTRAMUSCULAR | Status: AC
  Filled 2018-04-21: qty 50

## 2018-04-21 MED ORDER — IOPAMIDOL (ISOVUE-300) INJECTION 61%
INTRAVENOUS | Status: AC
  Filled 2018-04-21: qty 100

## 2018-04-21 MED ORDER — SODIUM CHLORIDE 0.9 % IV BOLUS
1000.0000 mL | Freq: Once | INTRAVENOUS | Status: AC
  Administered 2018-04-21: 1000 mL via INTRAVENOUS

## 2018-04-21 MED ORDER — LIDOCAINE HCL URETHRAL/MUCOSAL 2 % EX GEL
1.0000 "application " | Freq: Once | CUTANEOUS | Status: DC
  Filled 2018-04-21: qty 5

## 2018-04-21 NOTE — Consult Note (Signed)
Re:   Nyaja Dubuque DOB:   1940/12/18 MRN:   [id number]  Chief Complaint Abdominal pain  ASSESEMENT AND PLAN: 1.  Small bowel obstruction  Recurrent after she has hardly been home.  Will redo the small bowel protocol, but more likely to need surgery this admission.  They had trouble passing her NGt last time.  She is very anxious not to have surgery.  Plan:  IVG, SB protocol, follow up labs in AM.  2.  HTN x 7 years 3.  History of breast cancer - 1994 4.  History of hysterectomy/BSO - 1997  For uterine cancer 5.  History of diverticulitis - 2012 6.  Left lower renal stone  Chief Complaint  Patient presents with  . abdominal distention  . Nausea   PHYSICIAN REQUESTING CONSULTATION: Dr. Deno Etienne, [facility]  HISTORY OF PRESENT ILLNESS: Mckenzi Buonomo is a 78 y.o. (DOB: [birth year])  white female whose primary care physician is [name] I, NP.  She is by herself.   She was just hospitalized from 04/14/2018 to 04/17/2018 for a SBO and seen by Drs. Marcello Moores and [place] by our service. She got better and went home Monday, 3/2.  But on Tuesday, 3/3, and each day since she has felt more distended.  She finally felt bad enough to return to the [facility] today.  Her only abdominal surgery was a TAH/BSO in 1994 for endometrial cancer.  She has not history of stomach, gall bladder, or pancreas disease.  She did have bout of diverticulitis in 2012.  CT scan of abdomen - 04/21/2018 - 1. Recurrent/residual distal small bowel obstruction in the left lower quadrant. No definite obstructing mass lesion is present.  2. Abdominal ascites with fluid around the liver and into the anatomic pelvis is likely reactive.  3. Extensive colonic diverticulosis without focal inflammatory change to suggest diverticulitis.  4.  Aortic Atherosclerosis (ICD10-I70.0).  5. Punctate nonobstructing stone at the lower pole of the left kidney.  6. Degenerative changes in the lumbar spine.  WBC - 9,500 -  04/21/2018    Past Medical History:  Diagnosis Date  . Breast cancer ([place])   . Diverticulitis   . Hypertension   . Uterine cancer [facility])       Past Surgical History:  Procedure Laterality Date  . ABDOMINAL HYSTERECTOMY    . BREAST LUMPECTOMY    . EYE SURGERY        Current Facility-Administered Medications  Medication Dose Route Frequency Provider Last Rate Last Dose  . sodium chloride (PF) 0.9 % injection            Current Outpatient Medications  Medication Sig Dispense Refill  . ACIDOPHILUS LACTOBACILLUS PO Take 1 capsule by mouth daily.    . celecoxib (CELEBREX) 200 MG capsule Take 200 mg by mouth daily with breakfast.    . Cholecalciferol (VITAMIN D3) 25 MCG (1000 UT) CAPS Take 1,000 Units by mouth daily.    Marland Kitchen DYMISTA 137-50 MCG/ACT SUSP Place 1 spray into both nostrils every evening.    Marland Kitchen lisinopril (PRINIVIL,ZESTRIL) 10 MG tablet Take 10 mg by mouth daily.    . Multiple Vitamin (MULTIVITAMIN) capsule Take 1 capsule by mouth daily.        Allergies  Allergen Reactions  . Other Swelling and Other (See Comments)    "eye drops" (PVA)    REVIEW OF SYSTEMS: Skin:  No history of rash.  No history of abnormal moles. Infection:  No history of hepatitis or HIV.  No  history of MRSA. Neurologic:  No history of stroke.  No history of seizure.  No history of headaches. Cardiac:  HTN x 7 years.  No history of prior cardiac catheterization.   Pulmonary:  Does not smoke cigarettes.  No asthma or bronchitis.  No OSA/CPAP.  Endocrine:  No diabetes. No thyroid disease. Gastrointestinal:  See HPI Urologic:  No history of kidney stones.  No history of bladder infections. GYN:  Uterine Ca - TAH/BSO in 1997 Musculoskeletal:  Degenerative disease of the back, but does not see any one at this time. Hematologic:  No bleeding disorder.  No history of anemia.  Not anticoagulated. Psycho-social:  The patient is oriented.   The patient has no obvious psychologic or social impairment to  understanding our conversation and plan.  SOCIAL and FAMILY HISTORY: Married, but husband has some early dementia. She lives at [facility].  She moved there from [place] in Aug 2019.  She has 3 children:  55 yo son, 38 yo daughter, and 42 yo daughter.  Two of her children live in [place].  PHYSICAL EXAM: BP (!) 146/84   Pulse 88   Temp 98.1 F (36.7 C) (Oral)   Resp 16   SpO2 98%   General: WN WF who is alert and generally healthy appearing.  Skin:  Inspection and palpation - no mass or rash. Eyes:  Conjunctiva and lids unremarkable.            Pupils are equal Ears, Nose, Mouth, and Throat:  Ears and nose unremarkable            Lips and teeth are unremarable. Neck: Supple. No mass, trachea midline.  No thyroid mass. Lymph Nodes:  No supraclavicular, cervical, or inguinal nodes. Lungs: Normal respiratory effort.  Clear to auscultation and symmetric breath sounds. Heart:  Palpation of the heart is normal.            Auscultation: RRR. No murmur or rub.  Abdomen: No mass. No tenderness. No hernia.   Distended.  Decreased BS.  Long lower midline incision.  Rectal: Not done. Musculoskeletal:  Good muscle strength and ROM  in upper and lower extremities.  Neurologic:  Grossly intact to motor and sensory function. Psychiatric: Normal judgement and insight. Behavior is normal.            Oriented to time, person, place.   DATA REVIEWED, COUNSELING AND COORDINATION OF CARE: Epic notes reviewed. Counseling and coordination of care exceeded more than 50% of the time spent with patient. Total time spent with patient and charting: 50 minutes  Alphonsa Overall, MD,  [facility], [address]  [place], [place]    [place] Phone:  [phone] FAX:  [phone]

## 2018-04-21 NOTE — ED Triage Notes (Signed)
Pt reports she was just discharged from here couple days ago and reports still having abd distention and nausea.

## 2018-04-21 NOTE — ED Notes (Addendum)
ED TO INPATIENT HANDOFF REPORT  ED Nurse Name and Phone #: Fredonia Highland [phone]  S Name/Age/Gender Chelsea Petersen 78 y.o. female Room/Bed: WA12/WA12  Code Status   Code Status: Prior  Home/SNF/Other Home Patient oriented to: self, place, time and situation Is this baseline? Yes   Triage Complete: Triage complete  Chief Complaint abdominal pain   Triage Note Pt reports she was just discharged from here couple days ago and reports still having abd distention and nausea.    Allergies Allergies  Allergen Reactions  . Other Swelling and Other (See Comments)    "eye drops" (PVA)    Level of Care/Admitting Diagnosis ED Disposition    ED Disposition Condition [place]: [place] [[id number]]  Level of Care: Med-Surg [16]  Diagnosis: Small bowel obstruction [facility]) [[id number]]  Admitting Physician: Eston Esters  Attending Physician: Gwynne Edinger [[id number]]  Estimated length of stay: past midnight tomorrow  Certification:: I certify this patient will need inpatient services for at least 2 midnights  PT Class (Do Not Modify): Inpatient [101]  PT Acc Code (Do Not Modify): Private [1]       B Medical/Surgery History Past Medical History:  Diagnosis Date  . Breast cancer ([place])   . Diverticulitis   . Hypertension   . Uterine cancer [facility])    Past Surgical History:  Procedure Laterality Date  . ABDOMINAL HYSTERECTOMY    . BREAST LUMPECTOMY    . EYE SURGERY       A IV Location/Drains/Wounds Patient Lines/Drains/Airways Status   Active Line/Drains/Airways    Name:   Placement date:   Placement time:   Site:   Days:   Peripheral IV 04/21/18 Right;Upper Arm   04/21/18    1701    Arm   less than 1          Intake/Output Last 24 hours No intake or output data in the 24 hours ending 04/21/18 2319  Labs/Imaging Results for orders placed or performed during the hospital encounter of 04/21/18 (from the past 48  hour(s))  Comprehensive metabolic panel     Status: Abnormal   Collection Time: 04/21/18  4:36 PM  Result Value Ref Range   Sodium 131 (L) 135 - 145 mmol/L   Potassium 4.2 3.5 - 5.1 mmol/L   Chloride 100 98 - 111 mmol/L   CO2 22 22 - 32 mmol/L   Glucose, Bld 100 (H) 70 - 99 mg/dL   BUN 13 8 - 23 mg/dL   Creatinine, Ser 0.99 0.44 - 1.00 mg/dL   Calcium 9.5 8.9 - 10.3 mg/dL   Total Protein 6.7 6.5 - 8.1 g/dL   Albumin 4.0 3.5 - 5.0 g/dL   AST 51 (H) 15 - 41 U/L   ALT 70 (H) 0 - 44 U/L   Alkaline Phosphatase 67 38 - 126 U/L   Total Bilirubin 1.0 0.3 - 1.2 mg/dL   GFR calc non Af Amer 54 (L) >60 mL/min   GFR calc Af Amer >60 >60 mL/min   Anion gap 9 5 - 15    Comment: Performed at [facility], [place] [address] [place], [place] [address]  Lipase, blood     Status: Abnormal   Collection Time: 04/21/18  4:36 PM  Result Value Ref Range   Lipase 73 (H) 11 - 51 U/L    Comment: Performed at [facility], [place] [address] [place], [place] [address]  I-stat Creatinine,  ED     Status: None   Collection Time: 04/21/18  4:42 PM  Result Value Ref Range   Creatinine, Ser 1.00 0.44 - 1.00 mg/dL  CBC with Differential/Platelet     Status: None   Collection Time: 04/21/18  6:00 PM  Result Value Ref Range   WBC 9.5 4.0 - 10.5 K/uL   RBC 3.98 3.87 - 5.11 MIL/uL   Hemoglobin 12.2 12.0 - 15.0 g/dL   HCT 39.3 36.0 - 46.0 %   MCV 98.7 80.0 - 100.0 fL   MCH 30.7 26.0 - 34.0 pg   MCHC 31.0 30.0 - 36.0 g/dL   RDW 12.0 11.5 - 15.5 %   Platelets 333 150 - 400 K/uL   nRBC 0.0 0.0 - 0.2 %   Neutrophils Relative % 77 %   Neutro Abs 7.2 1.7 - 7.7 K/uL   Lymphocytes Relative 14 %   Lymphs Abs 1.3 0.7 - 4.0 K/uL   Monocytes Relative 9 %   Monocytes Absolute 0.8 0.1 - 1.0 K/uL   Eosinophils Relative 0 %   Eosinophils Absolute 0.0 0.0 - 0.5 K/uL   Basophils Relative 0 %   Basophils Absolute 0.0 0.0 - 0.1 K/uL   Immature Granulocytes 0 %   Abs Immature Granulocytes  0.04 0.00 - 0.07 K/uL    Comment: Performed at [facility], [place] [address], [place],  [address]   Ct Abdomen Pelvis W Contrast  Result Date: 04/21/2018 CLINICAL DATA:  Small bowel obstruction. Personal history of uterine cancer and left breast cancer. EXAM: CT ABDOMEN AND PELVIS WITH CONTRAST TECHNIQUE: Multidetector CT imaging of the abdomen and pelvis was performed using the standard protocol following bolus administration of intravenous contrast. CONTRAST:  <See Chart> ISOVUE-300 IOPAMIDOL (ISOVUE-300) INJECTION 61% COMPARISON:  CT of the abdomen and pelvis 04/13/2018. One-view abdomen 04/16/2018. FINDINGS: Lower chest: Focal atelectasis is again noted base. At the left the heart is in the left side of the chest. The patient is status post left mastectomy. Right lung bases clear. Hepatobiliary: No focal liver abnormality is seen. No gallstones, gallbladder wall thickening, or biliary dilatation. Pancreas: Unremarkable. No pancreatic ductal dilatation or surrounding inflammatory changes. Spleen: Normal in size without focal abnormality. Adrenals/Urinary Tract: Adrenal glands are normal bilaterally. A punctate nonobstructing stone is present at the lower pole of the left kidney. Kidneys and ureters are otherwise normal. The urinary bladder is within normal limits. Stomach/Bowel: Stomach is distended. Proximal bowel loops are distended up to 4 cm. Focal obstruction is again noted in the left lower quadrant. The more distal bowel is collapsed. Diverticular changes are present throughout the colon. There is some contrast throughout the colon. The colon is mostly collapsed. Vascular/Lymphatic: Atherosclerotic calcifications are present in the aorta and branch vessels. There is no aneurysm. Reproductive: Status post hysterectomy. No adnexal masses. Other: A small amount of free fluid is present. There is fluid about the liver and in the right pericolic gutter. Musculoskeletal: Vertebral  body heights are preserved. There is some straightening of the normal lumbar lordosis. Focal rightward curvature of the lumbar spine is centered at L2. Endplate changes are noted. Multilevel facet degenerative changes are present. Bony pelvis is within normal limits. Hips are located and unremarkable. IMPRESSION: 1. Recurrent/residual distal small bowel obstruction in the left lower quadrant. No definite obstructing mass lesion is present. 2. Abdominal ascites with fluid around the liver and into the anatomic pelvis is likely reactive. 3. Extensive colonic diverticulosis without focal inflammatory change to suggest  diverticulitis. 4.  Aortic Atherosclerosis (ICD10-I70.0). 5. Punctate nonobstructing stone at the lower pole of the left kidney. 6. Degenerative changes in the lumbar spine. Electronically Signed   By: San Morelle M.D.   On: 04/21/2018 18:32    Pending Labs Unresulted Labs (From admission, onward)    Start     Ordered   04/22/18 0500  CBC with Differential/Platelet  Tomorrow morning,   R     04/21/18 2213   04/22/18 0277  Basic metabolic panel  Once,   R     04/21/18 2213   04/21/18 1609  CBC with Differential  ONCE - STAT,   STAT     04/21/18 1608   Signed and Held  CBC  (enoxaparin (LOVENOX)    CrCl >/= 30 ml/min)  Once,   R    Comments:  Baseline for enoxaparin therapy IF NOT ALREADY DRAWN.  Notify MD if PLT < 100 K.    Signed and Held   Signed and Held  Creatinine, serum  (enoxaparin (LOVENOX)    CrCl >/= 30 ml/min)  Once,   R    Comments:  Baseline for enoxaparin therapy IF NOT ALREADY DRAWN.    Signed and Held   Signed and Held  Creatinine, serum  (enoxaparin (LOVENOX)    CrCl >/= 30 ml/min)  Weekly,   R    Comments:  while on enoxaparin therapy    Signed and Held   Signed and Held  Sodium, urine, random  Once,   R     Signed and Held   Signed and Held  Osmolality, urine  Once,   R     Signed and Held   Signed and Held  TSH  Add-on,   R     Signed and Held    Signed and Held  Comprehensive metabolic panel  Tomorrow morning,   STAT    Question:  Specimen collection method  Answer:  Unit=Unit collect   Signed and Held          Vitals/Pain Today's Vitals   04/21/18 2130 04/21/18 2200 04/21/18 2230 04/21/18 2314  BP: 127/83 129/84 (!) 145/76 (!) 168/104  Pulse: 72 82 78 86  Resp:    13  Temp:    98.6 F (37 C)  TempSrc:    Oral  SpO2: 96% 97% 97% 98%  Weight:    50.8 kg  Height:    5' (1.524 m)  PainSc:    0-No pain    Isolation Precautions No active isolations  Medications Medications  sodium chloride (PF) 0.9 % injection (has no administration in time range)  diatrizoate meglumine-sodium (GASTROGRAFIN) 66-10 % solution 90 mL (has no administration in time range)  sodium chloride 0.9 % bolus 1,000 mL (1,000 mLs Intravenous New Bag/Given (Non-Interop) 04/21/18 1702)  ondansetron (ZOFRAN) injection 4 mg (4 mg Intravenous Given 04/21/18 1702)  iopamidol (ISOVUE-300) 61 % injection 100 mL (80 mLs Intravenous Contrast Given 04/21/18 1753)    Mobility walks Low fall risk   Focused Assessments GI   R Recommendations: See Admitting Provider Note  Report given to: Eugenia RN  Additional Notes:  Xray [phone])  Call xray after gastrografin is given. And then patient needs to be off suction until after xray.

## 2018-04-21 NOTE — ED Provider Notes (Signed)
[facility] DEPT Provider Note   CSN: [id number] Arrival date & time: 04/21/18  1524    History   Chief Complaint Chief Complaint  Patient presents with  . abdominal distention  . Nausea    HPI Chelsea Petersen is a 78 y.o. female.     78 yo F with a chief complaint of abdominal distention nausea vomiting and constipation.  Patient was just discharged 4 days ago for a small bowel obstruction.  Since then she has had worsening abdominal distention nausea then vomited earlier today.  States that she has had a very small amount of flatus today.  Has had 2 small bowel movements since being discharged from the hospital.  The history is provided by the patient.  Illness  Severity:  Moderate Onset quality:  Gradual Duration:  2 weeks Timing:  Constant Progression:  Worsening Chronicity:  New Associated symptoms: abdominal pain, nausea and vomiting   Associated symptoms: no chest pain, no congestion, no fever, no headaches, no myalgias, no rhinorrhea, no shortness of breath and no wheezing     Past Medical History:  Diagnosis Date  . Breast cancer ([place])   . Diverticulitis   . Hypertension   . Uterine cancer [facility])     Patient Active Problem List   Diagnosis Date Noted  . AKI (acute kidney injury) ([place]) 04/14/2018  . Acute cystitis without hematuria 04/14/2018  . Essential hypertension 04/14/2018  . SBO (small bowel obstruction) ([place]) 04/13/2018    Past Surgical History:  Procedure Laterality Date  . ABDOMINAL HYSTERECTOMY    . BREAST LUMPECTOMY    . EYE SURGERY       OB History   No obstetric history on file.      Home Medications    Prior to Admission medications   Medication Sig Start Date End Date Taking? Authorizing Provider  ACIDOPHILUS LACTOBACILLUS PO Take 1 capsule by mouth daily.   Yes [provider]  celecoxib (CELEBREX) 200 MG capsule Take 200 mg by mouth daily with breakfast. 03/10/18  Yes [provider]  Cholecalciferol (VITAMIN D3) 25 MCG (1000 UT) CAPS Take 1,000 Units by mouth daily.   Yes [provider]  DYMISTA 137-50 MCG/ACT SUSP Place 1 spray into both nostrils every evening. 02/06/18  Yes [provider]  lisinopril (PRINIVIL,ZESTRIL) 10 MG tablet Take 10 mg by mouth daily. 02/10/18  Yes [provider]  Multiple Vitamin (MULTIVITAMIN) capsule Take 1 capsule by mouth daily.   Yes [provider]    Family History Family History  Problem Relation Age of Onset  . Hypertension Mother   . Colon cancer Neg Hx   . Breast cancer Neg Hx     Social History Social History   Tobacco Use  . Smoking status: Never Smoker  . Smokeless tobacco: Never Used  Substance Use Topics  . Alcohol use: Yes    Comment: occ  . Drug use: Never     Allergies   Other   Review of Systems Review of Systems  Constitutional: Negative for chills and fever.  HENT: Negative for congestion and rhinorrhea.   Eyes: Negative for redness and visual disturbance.  Respiratory: Negative for shortness of breath and wheezing.   Cardiovascular: Negative for chest pain and palpitations.  Gastrointestinal: Positive for abdominal distention, abdominal pain, nausea and vomiting.  Genitourinary: Negative for dysuria and urgency.  Musculoskeletal: Negative for arthralgias and myalgias.  Skin: Negative for pallor and wound.  Neurological: Negative for dizziness and  headaches.     Physical Exam Updated Vital Signs BP (!) 155/81 (BP Location: Right Arm)   Pulse 90   Temp 98.1 F (36.7 C) (Oral)   Resp 17   SpO2 98%   Physical Exam Vitals signs and nursing note reviewed.  Constitutional:      General: She is not in acute distress.    Appearance: She is well-developed. She is not diaphoretic.  HENT:     Head: Normocephalic and atraumatic.  Eyes:     Pupils: Pupils are equal, round, and reactive to light.  Neck:     Musculoskeletal: Normal range of  motion and neck supple.  Cardiovascular:     Rate and Rhythm: Normal rate and regular rhythm.     Heart sounds: No murmur. No friction rub. No gallop.   Pulmonary:     Effort: Pulmonary effort is normal.     Breath sounds: No wheezing or rales.  Abdominal:     General: There is distension (tympanitic to percussion).     Palpations: Abdomen is soft.     Tenderness: There is abdominal tenderness (mild diffuse).  Musculoskeletal:        General: No tenderness.  Skin:    General: Skin is warm and dry.  Neurological:     Mental Status: She is alert and oriented to person, place, and time.  Psychiatric:        Behavior: Behavior normal.      ED Treatments / Results  Labs (all labs ordered are listed, but only abnormal results are displayed) Labs Reviewed  COMPREHENSIVE METABOLIC PANEL - Abnormal; Notable for the following components:      Result Value   Sodium 131 (*)    Glucose, Bld 100 (*)    AST 51 (*)    ALT 70 (*)    GFR calc non Af Amer 54 (*)    All other components within normal limits  LIPASE, BLOOD - Abnormal; Notable for the following components:   Lipase 73 (*)    All other components within normal limits  CBC WITH DIFFERENTIAL/PLATELET  CBC WITH DIFFERENTIAL/PLATELET  I-STAT CREATININE, ED    EKG None  Radiology Ct Abdomen Pelvis W Contrast  Result Date: 04/21/2018 CLINICAL DATA:  Small bowel obstruction. Personal history of uterine cancer and left breast cancer. EXAM: CT ABDOMEN AND PELVIS WITH CONTRAST TECHNIQUE: Multidetector CT imaging of the abdomen and pelvis was performed using the standard protocol following bolus administration of intravenous contrast. CONTRAST:  <See Chart> ISOVUE-300 IOPAMIDOL (ISOVUE-300) INJECTION 61% COMPARISON:  CT of the abdomen and pelvis 04/13/2018. One-view abdomen 04/16/2018. FINDINGS: Lower chest: Focal atelectasis is again noted base. At the left the heart is in the left side of the chest. The patient is status post left  mastectomy. Right lung bases clear. Hepatobiliary: No focal liver abnormality is seen. No gallstones, gallbladder wall thickening, or biliary dilatation. Pancreas: Unremarkable. No pancreatic ductal dilatation or surrounding inflammatory changes. Spleen: Normal in size without focal abnormality. Adrenals/Urinary Tract: Adrenal glands are normal bilaterally. A punctate nonobstructing stone is present at the lower pole of the left kidney. Kidneys and ureters are otherwise normal. The urinary bladder is within normal limits. Stomach/Bowel: Stomach is distended. Proximal bowel loops are distended up to 4 cm. Focal obstruction is again noted in the left lower quadrant. The more distal bowel is collapsed. Diverticular changes are present throughout the colon. There is some contrast throughout the colon. The colon is mostly collapsed. Vascular/Lymphatic: Atherosclerotic calcifications are present  in the aorta and branch vessels. There is no aneurysm. Reproductive: Status post hysterectomy. No adnexal masses. Other: A small amount of free fluid is present. There is fluid about the liver and in the right pericolic gutter. Musculoskeletal: Vertebral body heights are preserved. There is some straightening of the normal lumbar lordosis. Focal rightward curvature of the lumbar spine is centered at L2. Endplate changes are noted. Multilevel facet degenerative changes are present. Bony pelvis is within normal limits. Hips are located and unremarkable. IMPRESSION: 1. Recurrent/residual distal small bowel obstruction in the left lower quadrant. No definite obstructing mass lesion is present. 2. Abdominal ascites with fluid around the liver and into the anatomic pelvis is likely reactive. 3. Extensive colonic diverticulosis without focal inflammatory change to suggest diverticulitis. 4.  Aortic Atherosclerosis (ICD10-I70.0). 5. Punctate nonobstructing stone at the lower pole of the left kidney. 6. Degenerative changes in the lumbar  spine. Electronically Signed   By: San Morelle M.D.   On: 04/21/2018 18:32    Procedures Procedures (including critical care time)  Medications Ordered in ED Medications  sodium chloride (PF) 0.9 % injection (has no administration in time range)  sodium chloride 0.9 % bolus 1,000 mL (1,000 mLs Intravenous New Bag/Given (Non-Interop) 04/21/18 1702)  ondansetron (ZOFRAN) injection 4 mg (4 mg Intravenous Given 04/21/18 1702)  iopamidol (ISOVUE-300) 61 % injection 100 mL (80 mLs Intravenous Contrast Given 04/21/18 1753)     Initial Impression / Assessment and Plan / ED Course  I have reviewed the triage vital signs and the nursing notes.  Pertinent labs & imaging results that were available during my care of the patient were reviewed by me and considered in my medical decision making (see chart for details).        78 yo F with a chief complaint of worsening abdominal distention after being discharged for recent small bowel obstruction.  Will obtain a repeat CT scan lab work.  CT scan is concerning for continued small bowel obstruction.  I discussed the case with Dr. Lucia Gaskins, general surgery who recommended medical admission and he will evaluate the patient either this evening or tomorrow morning.  I discussed the results with the patient and I talked to her about placing an NG tube, states that last time she was here and had to be placed with IR guidance.  The patients results and plan were reviewed and discussed.   Any x-rays performed were independently reviewed by myself.   Differential diagnosis were considered with the presenting HPI.  Medications  sodium chloride (PF) 0.9 % injection (has no administration in time range)  sodium chloride 0.9 % bolus 1,000 mL (1,000 mLs Intravenous New Bag/Given (Non-Interop) 04/21/18 1702)  ondansetron (ZOFRAN) injection 4 mg (4 mg Intravenous Given 04/21/18 1702)  iopamidol (ISOVUE-300) 61 % injection 100 mL (80 mLs Intravenous Contrast Given  04/21/18 1753)    Vitals:   04/21/18 1533  BP: (!) 155/81  Pulse: 90  Resp: 17  Temp: 98.1 F (36.7 C)  TempSrc: Oral  SpO2: 98%    Final diagnoses:  SBO (small bowel obstruction) (HCC)    Admission/ observation were discussed with the admitting physician, patient and/or family and they are comfortable with the plan.    Final Clinical Impressions(s) / ED Diagnoses   Final diagnoses:  SBO (small bowel obstruction) [facility])    ED Discharge Orders    None       Deno Etienne, DO 04/21/18 2021

## 2018-04-21 NOTE — ED Notes (Signed)
Patient transported to CT 

## 2018-04-21 NOTE — H&P (Signed)
History and Physical    Shacoya Burkhammer GHW:[id number] DOB: 10/30/1940 DOA: 04/21/2018  PCP: [name] I, NP  Patient coming from: home   Chief Complaint: abdominal distention and nausea  HPI: Chelsea Petersen is a 78 y.o. female with medical history significant for htn, distand hx breast cancer, distant history uterine cancer s/p hysterectomy, who presents with the above complaint.  Recent admission, discharged 4 days ago, for small bowel obstruction, thought to be mechanical 2/2 adhesions. Managed expectantly and improved. Upon returning home had a "good" couple of days, but starting about 2 days ago gradually abdomen became more and more distended, developed nausea, and then one episode of nbnb emesis earlier today. Currently complaining of heartburn, no nausea after anti-emetics in the ED. Has had small-volume bowel movements last several days and scant flatus. No focal abdominal pain. No chest pain or sob. No fevers or chills. No dysuria. Only abd surgery was hysterectomy ~25 years ago  ED Course: gen surg consult, fluids, labs, CT abdomen  Review of Systems: As per HPI otherwise 10 point review of systems negative.    Past Medical History:  Diagnosis Date  . Breast cancer ([place])   . Diverticulitis   . Hypertension   . Uterine cancer [facility])     Past Surgical History:  Procedure Laterality Date  . ABDOMINAL HYSTERECTOMY    . BREAST LUMPECTOMY    . EYE SURGERY       reports that she has never smoked. She has never used smokeless tobacco. She reports current alcohol use. She reports that she does not use drugs.  Allergies  Allergen Reactions  . Other Swelling and Other (See Comments)    "eye drops" ([facility])    Family History  Problem Relation Age of Onset  . Hypertension Mother   . Colon cancer Neg Hx   . Breast cancer Neg Hx     Prior to Admission medications   Medication Sig Start Date End Date Taking? Authorizing Provider  ACIDOPHILUS LACTOBACILLUS PO Take 1  capsule by mouth daily.   Yes [provider]  celecoxib (CELEBREX) 200 MG capsule Take 200 mg by mouth daily with breakfast. 03/10/18  Yes [provider]  Cholecalciferol (VITAMIN D3) 25 MCG (1000 UT) CAPS Take 1,000 Units by mouth daily.   Yes [provider]  DYMISTA 137-50 MCG/ACT SUSP Place 1 spray into both nostrils every evening. 02/06/18  Yes [provider]  lisinopril (PRINIVIL,ZESTRIL) 10 MG tablet Take 10 mg by mouth daily. 02/10/18  Yes [provider]  Multiple Vitamin (MULTIVITAMIN) capsule Take 1 capsule by mouth daily.   Yes [provider]    Physical Exam: Vitals:   04/21/18 1533 04/21/18 2036  BP: (!) 155/81 (!) 146/84  Pulse: 90 88  Resp: 17 16  Temp: 98.1 F (36.7 C)   TempSrc: Oral   SpO2: 98% 98%    Constitutional: No acute distress Head: Atraumatic Eyes: Conjunctiva clear ENM: Moist mucous membranes. Normal dentition.  Neck: Supple Respiratory: Clear to auscultation bilaterally, no wheezing/rales/rhonchi. Normal respiratory effort. No accessory muscle use. . Cardiovascular: Regular rate and rhythm. No murmurs/rubs/gallops. Abdomen: mod distention, no ttp, no bowel sounds lower quadrants and decreased in upper Musculoskeletal: No joint deformity upper and lower extremities. Normal ROM, no contractures. Normal muscle tone.  Skin: No rashes, lesions, or ulcers.  Extremities: No peripheral edema. Palpable peripheral pulses. Neurologic: Alert, moving all 4 extremities. Psychiatric: Normal insight and judgement.   Labs on Admission: I have personally reviewed following  labs and imaging studies  CBC: Recent Labs  Lab 04/21/18 1800  WBC 9.5  NEUTROABS 7.2  HGB 12.2  HCT 39.3  MCV 98.7  PLT 284   Basic Metabolic Panel: Recent Labs  Lab 04/15/18 0442 04/21/18 1636 04/21/18 1642  NA 135 131*  --   K 4.0 4.2  --   CL 98 100  --   CO2 26 22  --   GLUCOSE 76 100*  --   BUN 33* 13  --     CREATININE 1.15* 0.99 1.00  CALCIUM 9.0 9.5  --    GFR: Estimated Creatinine Clearance: 32.8 mL/min (by C-G formula based on SCr of 1 mg/dL). Liver Function Tests: Recent Labs  Lab 04/21/18 1636  AST 51*  ALT 70*  ALKPHOS 67  BILITOT 1.0  PROT 6.7  ALBUMIN 4.0   Recent Labs  Lab 04/21/18 1636  LIPASE 73*   No results for input(s): AMMONIA in the last 168 hours. Coagulation Profile: No results for input(s): INR, PROTIME in the last 168 hours. Cardiac Enzymes: No results for input(s): CKTOTAL, CKMB, CKMBINDEX, TROPONINI in the last 168 hours. BNP (last 3 results) No results for input(s): PROBNP in the last 8760 hours. HbA1C: No results for input(s): HGBA1C in the last 72 hours. CBG: Recent Labs  Lab 04/15/18 2332 04/16/18 0755 04/16/18 1606 04/17/18 0557 04/17/18 1608  GLUCAP 72 94 139* 98 106*   Lipid Profile: No results for input(s): CHOL, HDL, LDLCALC, TRIG, CHOLHDL, LDLDIRECT in the last 72 hours. Thyroid Function Tests: No results for input(s): TSH, T4TOTAL, FREET4, T3FREE, THYROIDAB in the last 72 hours. Anemia Panel: No results for input(s): VITAMINB12, FOLATE, FERRITIN, TIBC, IRON, RETICCTPCT in the last 72 hours. Urine analysis:    Component Value Date/Time   COLORURINE YELLOW 04/13/2018 1631   APPEARANCEUR HAZY (A) 04/13/2018 1631   LABSPEC >1.030 (H) 04/13/2018 1631   PHURINE 5.0 04/13/2018 1631   GLUCOSEU NEGATIVE 04/13/2018 1631   HGBUR TRACE (A) 04/13/2018 1631   BILIRUBINUR SMALL (A) 04/13/2018 1631   KETONESUR NEGATIVE 04/13/2018 1631   PROTEINUR NEGATIVE 04/13/2018 1631   NITRITE NEGATIVE 04/13/2018 1631   LEUKOCYTESUR TRACE (A) 04/13/2018 1631    Radiological Exams on Admission: Ct Abdomen Pelvis W Contrast  Result Date: 04/21/2018 CLINICAL DATA:  Small bowel obstruction. Personal history of uterine cancer and left breast cancer. EXAM: CT ABDOMEN AND PELVIS WITH CONTRAST TECHNIQUE: Multidetector CT imaging of the abdomen and pelvis was  performed using the standard protocol following bolus administration of intravenous contrast. CONTRAST:  <See Chart> ISOVUE-300 IOPAMIDOL (ISOVUE-300) INJECTION 61% COMPARISON:  CT of the abdomen and pelvis 04/13/2018. One-view abdomen 04/16/2018. FINDINGS: Lower chest: Focal atelectasis is again noted base. At the left the heart is in the left side of the chest. The patient is status post left mastectomy. Right lung bases clear. Hepatobiliary: No focal liver abnormality is seen. No gallstones, gallbladder wall thickening, or biliary dilatation. Pancreas: Unremarkable. No pancreatic ductal dilatation or surrounding inflammatory changes. Spleen: Normal in size without focal abnormality. Adrenals/Urinary Tract: Adrenal glands are normal bilaterally. A punctate nonobstructing stone is present at the lower pole of the left kidney. Kidneys and ureters are otherwise normal. The urinary bladder is within normal limits. Stomach/Bowel: Stomach is distended. Proximal bowel loops are distended up to 4 cm. Focal obstruction is again noted in the left lower quadrant. The more distal bowel is collapsed. Diverticular changes are present throughout the colon. There is some contrast throughout the colon. The colon  is mostly collapsed. Vascular/Lymphatic: Atherosclerotic calcifications are present in the aorta and branch vessels. There is no aneurysm. Reproductive: Status post hysterectomy. No adnexal masses. Other: A small amount of free fluid is present. There is fluid about the liver and in the right pericolic gutter. Musculoskeletal: Vertebral body heights are preserved. There is some straightening of the normal lumbar lordosis. Focal rightward curvature of the lumbar spine is centered at L2. Endplate changes are noted. Multilevel facet degenerative changes are present. Bony pelvis is within normal limits. Hips are located and unremarkable. IMPRESSION: 1. Recurrent/residual distal small bowel obstruction in the left lower  quadrant. No definite obstructing mass lesion is present. 2. Abdominal ascites with fluid around the liver and into the anatomic pelvis is likely reactive. 3. Extensive colonic diverticulosis without focal inflammatory change to suggest diverticulitis. 4.  Aortic Atherosclerosis (ICD10-I70.0). 5. Punctate nonobstructing stone at the lower pole of the left kidney. 6. Degenerative changes in the lumbar spine. Electronically Signed   By: San Morelle M.D.   On: 04/21/2018 18:32     Assessment/Plan Active Problems:   SBO (small bowel obstruction) (HCC)   Essential hypertension   Hyponatremia   Small bowel obstruction ([place])   # Small bowel obstruction - etiology not clear on CT, suspect adhesion. Well appearing. No sig nausea. Gen surg consulted, advising small bowel protocol. No AKI - small bowel protocol per gen surg, appreciate recs - d51/2ns @ 125 - npo - famotidine for gerd symptoms - ondansetron prn  # transaminitis - mild, with mildly elevated lipase. Prior LFTs wnl. Suspect 2/2 above intraabdominal process and mild dehydration - repeat LFTs in AM  # Hyponatremia - mild, chronic. Na 131 on admission. Euvolemic. Etiology unclear - check urine sodium, urine osm, tsh - repeat sodium in AM  # HTN - here mild elevation - hold home lisinopril while npo - monitor BP, IV antihypertensives if needed  # Chronic pain - hold home celebrex for now  DVT prophylaxis: lovenox Code Status: full  Family Communication: daughter amy reed [phone]  Disposition Plan: tbd  Consults called: gen surg  Admission status: med/surg    Desma Maxim MD [facility] Pager [phone]  If 7PM-7AM, please contact night-coverage [email or web address] Password [facility]  04/21/2018, 10:23 PM

## 2018-04-22 ENCOUNTER — Inpatient Hospital Stay (HOSPITAL_COMMUNITY): Payer: Medicare Other

## 2018-04-22 LAB — CBC WITH DIFFERENTIAL/PLATELET
Abs Immature Granulocytes: 0.04 10*3/uL (ref 0.00–0.07)
BASOS ABS: 0 10*3/uL (ref 0.0–0.1)
Basophils Relative: 0 %
Eosinophils Absolute: 0 10*3/uL (ref 0.0–0.5)
Eosinophils Relative: 1 %
HCT: 39.5 % (ref 36.0–46.0)
Hemoglobin: 12 g/dL (ref 12.0–15.0)
Immature Granulocytes: 1 %
Lymphocytes Relative: 17 %
Lymphs Abs: 1.3 10*3/uL (ref 0.7–4.0)
MCH: 30.4 pg (ref 26.0–34.0)
MCHC: 30.4 g/dL (ref 30.0–36.0)
MCV: 100 fL (ref 80.0–100.0)
Monocytes Absolute: 0.7 10*3/uL (ref 0.1–1.0)
Monocytes Relative: 10 %
NEUTROS ABS: 5.5 10*3/uL (ref 1.7–7.7)
Neutrophils Relative %: 71 %
Platelets: 326 10*3/uL (ref 150–400)
RBC: 3.95 MIL/uL (ref 3.87–5.11)
RDW: 12.1 % (ref 11.5–15.5)
WBC: 7.6 10*3/uL (ref 4.0–10.5)
nRBC: 0 % (ref 0.0–0.2)

## 2018-04-22 LAB — COMPREHENSIVE METABOLIC PANEL
ALT: 56 U/L — AB (ref 0–44)
AST: 38 U/L (ref 15–41)
Albumin: 3.6 g/dL (ref 3.5–5.0)
Alkaline Phosphatase: 59 U/L (ref 38–126)
Anion gap: 9 (ref 5–15)
BUN: 13 mg/dL (ref 8–23)
CO2: 22 mmol/L (ref 22–32)
Calcium: 8.8 mg/dL — ABNORMAL LOW (ref 8.9–10.3)
Chloride: 104 mmol/L (ref 98–111)
Creatinine, Ser: 0.99 mg/dL (ref 0.44–1.00)
GFR calc Af Amer: >60 mL/min (ref >60)
GFR calc non Af Amer: 54 mL/min — ABNORMAL LOW (ref >60)
GLUCOSE: 131 mg/dL — AB (ref 70–99)
Potassium: 3.5 mmol/L (ref 3.5–5.1)
Sodium: 135 mmol/L (ref 135–145)
Total Bilirubin: 0.7 mg/dL (ref 0.3–1.2)
Total Protein: 6 g/dL — ABNORMAL LOW (ref 6.5–8.1)

## 2018-04-22 LAB — TSH: TSH: 2.101 u[IU]/mL (ref 0.350–4.500)

## 2018-04-22 MED ORDER — FAMOTIDINE IN NACL 20-0.9 MG/50ML-% IV SOLN
20.0000 mg | Freq: Every day | INTRAVENOUS | Status: DC
  Administered 2018-04-22 – 2018-04-25 (×5): 20 mg via INTRAVENOUS
  Filled 2018-04-22 (×5): qty 50

## 2018-04-22 MED ORDER — MORPHINE SULFATE (PF) 2 MG/ML IV SOLN: 2.0000 mg | INTRAVENOUS | Status: DC | PRN

## 2018-04-22 MED ORDER — ENOXAPARIN SODIUM 40 MG/0.4ML ~~LOC~~ SOLN
40.0000 mg | Freq: Every day | SUBCUTANEOUS | Status: DC
  Administered 2018-04-22 – 2018-04-26 (×5): 40 mg via SUBCUTANEOUS
  Filled 2018-04-22 (×5): qty 0.4

## 2018-04-22 MED ORDER — ONDANSETRON HCL 4 MG/2ML IJ SOLN: 4.0000 mg | Freq: Four times a day (QID) | INTRAMUSCULAR | Status: DC | PRN

## 2018-04-22 MED ORDER — DEXTROSE-NACL 5-0.45 % IV SOLN
INTRAVENOUS | Status: DC
  Administered 2018-04-22 – 2018-04-23 (×4): via INTRAVENOUS

## 2018-04-22 MED ORDER — HYDRALAZINE HCL 20 MG/ML IJ SOLN
10.0000 mg | INTRAMUSCULAR | Status: DC | PRN
  Administered 2018-04-23 – 2018-04-24 (×3): 10 mg via INTRAVENOUS
  Filled 2018-04-22 (×4): qty 1

## 2018-04-22 NOTE — Progress Notes (Signed)
PROGRESS NOTE    Chelsea Petersen  QIH:[id number] DOB: [birth year] DOA: 04/21/2018 PCP: Linus Mako, NP   Brief Narrative: Patient is a 78 year old female with history of hypertension, breast cancer/uterine cancer on remission who presents with complaints of abdomen pain, distention and nausea.  Patient was just discharged 5 days ago after being managed for small bowel obstruction.  Imaging on presentation showed  recurrent small bowel obstruction.  Surgery consulted.  Conservative management started .  Assessment & Plan:   Active Problems:   SBO (small bowel obstruction) (HCC)   Essential hypertension   Hyponatremia   Small bowel obstruction (HCC)  Small bowel obstruction: Recurrent.  Was discharged recently after management for SBO.  Most likely associated with adhesions.  She has history of hysterectomy. Started on conservative management.  NG tube placed.  General surgery following.  Continue pain management, antiemetics, IV fluids.  Monitor electrolytes.  N.p.o.  Transaminitis: Mild.   Hyponatremia: Improved  Hypertension: On lisinopril at home.  Continue PRN meds here.  Chronic pain: On Celebrex at home  History of breast cancer: In remission  History of uterine cancer: Status post hysterectomy.          DVT prophylaxis:Lovenox Code Status: Full Family Communication: None present at the family Disposition Plan: Home after resolution of bowel obstruction   Consultants: General surgery  Procedures: NG tube insertion  Antimicrobials:  Anti-infectives (From admission, onward)   None      Subjective:  Patient seen and examined the bedside this morning.  Remains comfortable.  Abdomen pain has resolved.  No nausea or vomiting.  No bowel movement yet.  Has not passed flatus. Objective: Vitals:   04/21/18 2314 04/22/18 0026 04/22/18 0412 04/22/18 0905  BP: (!) 168/104 (!) 162/75 (!) 171/82 (!) 141/64  Pulse: 86 83 76 73  Resp: 13 14 15 18   Temp: 98.6 F  (37 C) 99.9 F (37.7 C) 99 F (37.2 C) 98.5 F (36.9 C)  TempSrc: Oral Oral Oral Oral  SpO2: 98% 97% 97% 97%  Weight: 50.8 kg     Height: 5' (1.524 m)       Intake/Output Summary (Last 24 hours) at 04/22/2018 1329 Last data filed at 04/22/2018 1100 Gross per 24 hour  Intake 1024.93 ml  Output 3650 ml  Net -2625.07 ml   Filed Weights   04/21/18 2314  Weight: 50.8 kg    Examination:  General exam: Appears calm and comfortable ,Not in distress,average built HEENT:PERRL,Oral mucosa moist, Ear/Nose normal on gross exam Respiratory system: Bilateral equal air entry, normal vesicular breath sounds, no wheezes or crackles  Cardiovascular system: S1 & S2 heard, RRR. No JVD, murmurs, rubs, gallops or clicks. No pedal edema. Gastrointestinal system: Abdomen is nondistended, soft and nontender. No organomegaly or masses felt. Sluggish bowel sounds heard. Central nervous system: Alert and oriented. No focal neurological deficits. Extremities: No edema, no clubbing ,no cyanosis, distal peripheral pulses palpable. Skin: No rashes, lesions or ulcers,no icterus ,no pallor MSK: Normal muscle bulk,tone ,power Psychiatry: Judgement and insight appear normal. Mood & affect appropriate.     Data Reviewed: I have personally reviewed following labs and imaging studies  CBC: Recent Labs  Lab 04/21/18 1800 04/22/18 0422  WBC 9.5 7.6  NEUTROABS 7.2 5.5  HGB 12.2 12.0  HCT 39.3 39.5  MCV 98.7 100.0  PLT 333 875   Basic Metabolic Panel: Recent Labs  Lab 04/21/18 1636 04/21/18 1642 04/22/18 0422  NA 131*  --  135  K 4.2  --  3.5  CL 100  --  104  CO2 22  --  22  GLUCOSE 100*  --  131*  BUN 13  --  13  CREATININE 0.99 1.00 0.99  CALCIUM 9.5  --  8.8*   GFR: Estimated Creatinine Clearance: 33.1 mL/min (by C-G formula based on SCr of 0.99 mg/dL). Liver Function Tests: Recent Labs  Lab 04/21/18 1636 04/22/18 0422  AST 51* 38  ALT 70* 56*  ALKPHOS 67 59  BILITOT 1.0 0.7  PROT  6.7 6.0*  ALBUMIN 4.0 3.6   Recent Labs  Lab 04/21/18 1636  LIPASE 73*   No results for input(s): AMMONIA in the last 168 hours. Coagulation Profile: No results for input(s): INR, PROTIME in the last 168 hours. Cardiac Enzymes: No results for input(s): CKTOTAL, CKMB, CKMBINDEX, TROPONINI in the last 168 hours. BNP (last 3 results) No results for input(s): PROBNP in the last 8760 hours. HbA1C: No results for input(s): HGBA1C in the last 72 hours. CBG: Recent Labs  Lab 04/15/18 2332 04/16/18 0755 04/16/18 1606 04/17/18 0557 04/17/18 1608  GLUCAP 72 94 139* 98 106*   Lipid Profile: No results for input(s): CHOL, HDL, LDLCALC, TRIG, CHOLHDL, LDLDIRECT in the last 72 hours. Thyroid Function Tests: Recent Labs    04/22/18 0422  TSH 2.101   Anemia Panel: No results for input(s): VITAMINB12, FOLATE, FERRITIN, TIBC, IRON, RETICCTPCT in the last 72 hours. Sepsis Labs: No results for input(s): PROCALCITON, LATICACIDVEN in the last 168 hours.  Recent Results (from the past 240 hour(s))  Urine culture     Status: None   Collection Time: 04/13/18  4:31 PM  Result Value Ref Range Status   Specimen Description   Final    URINE, RANDOM Performed at [facility], [address] [place], [place] [address]    Special Requests   Final    NONE Performed at [facility], [address] [place], [place] [address]    Culture   Final    NO GROWTH Performed at [facility], [place] [address], [place], [place] [address]    Report Status 04/15/2018 FINAL  Final         Radiology Studies: Ct Abdomen Pelvis W Contrast  Result Date: 04/21/2018 CLINICAL DATA:  Small bowel obstruction. Personal history of uterine cancer and left breast cancer. EXAM: CT ABDOMEN AND PELVIS WITH CONTRAST TECHNIQUE: Multidetector CT imaging of the abdomen and pelvis was performed using the standard protocol following bolus administration of intravenous contrast. CONTRAST:  <See  Chart> ISOVUE-300 IOPAMIDOL (ISOVUE-300) INJECTION 61% COMPARISON:  CT of the abdomen and pelvis 04/13/2018. One-view abdomen 04/16/2018. FINDINGS: Lower chest: Focal atelectasis is again noted base. At the left the heart is in the left side of the chest. The patient is status post left mastectomy. Right lung bases clear. Hepatobiliary: No focal liver abnormality is seen. No gallstones, gallbladder wall thickening, or biliary dilatation. Pancreas: Unremarkable. No pancreatic ductal dilatation or surrounding inflammatory changes. Spleen: Normal in size without focal abnormality. Adrenals/Urinary Tract: Adrenal glands are normal bilaterally. A punctate nonobstructing stone is present at the lower pole of the left kidney. Kidneys and ureters are otherwise normal. The urinary bladder is within normal limits. Stomach/Bowel: Stomach is distended. Proximal bowel loops are distended up to 4 cm. Focal obstruction is again noted in the left lower quadrant. The more distal bowel is collapsed. Diverticular changes are present throughout the colon. There is some contrast throughout the colon. The  colon is mostly collapsed. Vascular/Lymphatic: Atherosclerotic calcifications are present in the aorta and branch vessels. There is no aneurysm. Reproductive: Status post hysterectomy. No adnexal masses. Other: A small amount of free fluid is present. There is fluid about the liver and in the right pericolic gutter. Musculoskeletal: Vertebral body heights are preserved. There is some straightening of the normal lumbar lordosis. Focal rightward curvature of the lumbar spine is centered at L2. Endplate changes are noted. Multilevel facet degenerative changes are present. Bony pelvis is within normal limits. Hips are located and unremarkable. IMPRESSION: 1. Recurrent/residual distal small bowel obstruction in the left lower quadrant. No definite obstructing mass lesion is present. 2. Abdominal ascites with fluid around the liver and into  the anatomic pelvis is likely reactive. 3. Extensive colonic diverticulosis without focal inflammatory change to suggest diverticulitis. 4.  Aortic Atherosclerosis (ICD10-I70.0). 5. Punctate nonobstructing stone at the lower pole of the left kidney. 6. Degenerative changes in the lumbar spine. Electronically Signed   By: San Morelle M.D.   On: 04/21/2018 18:32   Dg Abd Portable 1v-small Bowel Obstruction Protocol-initial, 8 Hr Delay  Result Date: 04/22/2018 CLINICAL DATA:  Small-bowel obstruction. EXAM: PORTABLE ABDOMEN - 1 VIEW COMPARISON:  04/21/2018. FINDINGS: Nasogastric to tip in the region of the gastric pylorus. Injected contrast in the stomach with dilute contrast in mildly dilated small bowel loops. These do not appear as dilated as on the CT yesterday. Dense retained barium in a large number of colon diverticula and in the rectosigmoid colon. There is also some excreted contrast in the urinary bladder. Dextroconvex lumbar scoliosis and degenerative changes. IMPRESSION: Improving partial small bowel obstruction. Electronically Signed   By: Claudie Revering M.D.   On: 04/22/2018 10:07   Dg Abd Portable 1v-small Bowel Protocol-position Verification  Result Date: 04/22/2018 CLINICAL DATA:  NG tube placement. EXAM: PORTABLE ABDOMEN - 1 VIEW COMPARISON:  CT earlier this day FINDINGS: Tip and side port of the enteric tube below the diaphragm in the stomach. Dilated small bowel in CT is fluid-filled and not well seen by CT. Excreted IV contrast in the renal collecting systems from CT. Retained contrast within multiple diverticula in the colon. IMPRESSION: Tip and side port of the enteric tube below the diaphragm in the stomach. Electronically Signed   By: Keith Rake M.D.   On: 04/22/2018 00:12        Scheduled Meds: . enoxaparin (LOVENOX) injection  40 mg Subcutaneous Daily  . lidocaine  1 application Other Once   Continuous Infusions: . dextrose 5 % and 0.45% NaCl 100 mL/hr at  04/22/18 1100  . famotidine (PEPCID) IV Stopped (04/22/18 1029)     LOS: 1 day    Time spent: 25 mins.More than 50% of that time was spent in counseling and/or coordination of care.      Shelly Coss, MD [facility] Pager [phone]  If 7PM-7AM, please contact night-coverage [email or web address] Password [email or web address] 04/22/2018, 1:29 PM

## 2018-04-22 NOTE — Progress Notes (Signed)
[facility] Office:  [phone] General Surgery Progress Note   LOS: 1 day  POD -     Chief Complaint: Abdominal pain  Assessment and Plan: 1.  Recurrent SBO  Just had KUB - still with dilated SB and contrast has not gone far, but feels much better  Appears better.    2.  HTN x 7 years 3.  History of breast cancer - 1994 4.  History of hysterectomy/BSO - 1997             For uterine cancer 5.  History of diverticulitis - 2012 6.  Left lower renal stone 7.  DVT prophylaxis - Lovenox   Active Problems:   SBO (small bowel obstruction) (HCC)   Essential hypertension   Hyponatremia   Small bowel obstruction (HCC)  Subjective:  Feels better.  No BM.  Objective:   Vitals:   04/22/18 0412 04/22/18 0905  BP: (!) 171/82 (!) 141/64  Pulse: 76 73  Resp: 15 18  Temp: 99 F (37.2 C) 98.5 F (36.9 C)  SpO2: 97% 97%     Intake/Output from previous day:  03/06 0701 - 03/07 0700 In: 528.6 [I.V.:478.6; IV Piggyback:50] Out: 3150 [Urine:200; Emesis/NG output:2950]  Intake/Output this shift:  Total I/O In: 0  Out: 500 [Urine:100; Emesis/NG output:400]   Physical Exam:   General: Older WF who is alert and oriented.    HEENT: Normal. Pupils equal. .   Lungs: Clear.   Abdomen: Softer than last PM.  No localized tenderness.    Lab Results:    Recent Labs    04/21/18 1800 04/22/18 0422  WBC 9.5 7.6  HGB 12.2 12.0  HCT 39.3 39.5  PLT 333 326    BMET   Recent Labs    04/21/18 1636 04/21/18 1642 04/22/18 0422  NA 131*  --  135  K 4.2  --  3.5  CL 100  --  104  CO2 22  --  22  GLUCOSE 100*  --  131*  BUN 13  --  13  CREATININE 0.99 1.00 0.99  CALCIUM 9.5  --  8.8*    PT/INR  No results for input(s): LABPROT, INR in the last 72 hours.  ABG  No results for input(s): PHART, HCO3 in the last 72 hours.  Invalid input(s): PCO2, PO2   Studies/Results:  Ct Abdomen Pelvis W Contrast  Result Date: 04/21/2018 CLINICAL DATA:  Small bowel  obstruction. Personal history of uterine cancer and left breast cancer. EXAM: CT ABDOMEN AND PELVIS WITH CONTRAST TECHNIQUE: Multidetector CT imaging of the abdomen and pelvis was performed using the standard protocol following bolus administration of intravenous contrast. CONTRAST:  <See Chart> ISOVUE-300 IOPAMIDOL (ISOVUE-300) INJECTION 61% COMPARISON:  CT of the abdomen and pelvis 04/13/2018. One-view abdomen 04/16/2018. FINDINGS: Lower chest: Focal atelectasis is again noted base. At the left the heart is in the left side of the chest. The patient is status post left mastectomy. Right lung bases clear. Hepatobiliary: No focal liver abnormality is seen. No gallstones, gallbladder wall thickening, or biliary dilatation. Pancreas: Unremarkable. No pancreatic ductal dilatation or surrounding inflammatory changes. Spleen: Normal in size without focal abnormality. Adrenals/Urinary Tract: Adrenal glands are normal bilaterally. A punctate nonobstructing stone is present at the lower pole of the left kidney. Kidneys and ureters are otherwise normal. The urinary bladder is within normal limits. Stomach/Bowel: Stomach is distended. Proximal bowel loops are distended up to 4 cm. Focal obstruction is again noted in the left lower quadrant.  The more distal bowel is collapsed. Diverticular changes are present throughout the colon. There is some contrast throughout the colon. The colon is mostly collapsed. Vascular/Lymphatic: Atherosclerotic calcifications are present in the aorta and branch vessels. There is no aneurysm. Reproductive: Status post hysterectomy. No adnexal masses. Other: A small amount of free fluid is present. There is fluid about the liver and in the right pericolic gutter. Musculoskeletal: Vertebral body heights are preserved. There is some straightening of the normal lumbar lordosis. Focal rightward curvature of the lumbar spine is centered at L2. Endplate changes are noted. Multilevel facet degenerative  changes are present. Bony pelvis is within normal limits. Hips are located and unremarkable. IMPRESSION: 1. Recurrent/residual distal small bowel obstruction in the left lower quadrant. No definite obstructing mass lesion is present. 2. Abdominal ascites with fluid around the liver and into the anatomic pelvis is likely reactive. 3. Extensive colonic diverticulosis without focal inflammatory change to suggest diverticulitis. 4.  Aortic Atherosclerosis (ICD10-I70.0). 5. Punctate nonobstructing stone at the lower pole of the left kidney. 6. Degenerative changes in the lumbar spine. Electronically Signed   By: San Morelle M.D.   On: 04/21/2018 18:32   Dg Abd Portable 1v-small Bowel Protocol-position Verification  Result Date: 04/22/2018 CLINICAL DATA:  NG tube placement. EXAM: PORTABLE ABDOMEN - 1 VIEW COMPARISON:  CT earlier this day FINDINGS: Tip and side port of the enteric tube below the diaphragm in the stomach. Dilated small bowel in CT is fluid-filled and not well seen by CT. Excreted IV contrast in the renal collecting systems from CT. Retained contrast within multiple diverticula in the colon. IMPRESSION: Tip and side port of the enteric tube below the diaphragm in the stomach. Electronically Signed   By: Keith Rake M.D.   On: 04/22/2018 00:12     Anti-infectives:   Anti-infectives (From admission, onward)   None      Alphonsa Overall, MD, FACS Pager: [facility] Office: [phone] 04/22/2018

## 2018-04-23 ENCOUNTER — Inpatient Hospital Stay (HOSPITAL_COMMUNITY): Payer: Medicare Other

## 2018-04-23 DIAGNOSIS — E871 Hypo-osmolality and hyponatremia: Secondary | ICD-10-CM

## 2018-04-23 DIAGNOSIS — E876 Hypokalemia: Secondary | ICD-10-CM

## 2018-04-23 DIAGNOSIS — I10 Essential (primary) hypertension: Secondary | ICD-10-CM

## 2018-04-23 LAB — BASIC METABOLIC PANEL
Anion gap: 4 — ABNORMAL LOW (ref 5–15)
BUN: 9 mg/dL (ref 8–23)
CHLORIDE: 107 mmol/L (ref 98–111)
CO2: 23 mmol/L (ref 22–32)
Calcium: 8.2 mg/dL — ABNORMAL LOW (ref 8.9–10.3)
Creatinine, Ser: 0.84 mg/dL (ref 0.44–1.00)
GFR calc Af Amer: >60 mL/min (ref >60)
GFR calc non Af Amer: >60 mL/min (ref >60)
Glucose, Bld: 119 mg/dL — ABNORMAL HIGH (ref 70–99)
Potassium: 3.1 mmol/L — ABNORMAL LOW (ref 3.5–5.1)
Sodium: 134 mmol/L — ABNORMAL LOW (ref 135–145)

## 2018-04-23 LAB — GLUCOSE, CAPILLARY
Glucose-Capillary: 115 mg/dL — ABNORMAL HIGH (ref 70–99)
Glucose-Capillary: 102 mg/dL — ABNORMAL HIGH (ref 70–99)
Glucose-Capillary: 104 mg/dL — ABNORMAL HIGH (ref 70–99)
Glucose-Capillary: 92 mg/dL (ref 70–99)

## 2018-04-23 MED ORDER — KCL IN DEXTROSE-NACL 20-5-0.45 MEQ/L-%-% IV SOLN
INTRAVENOUS | Status: DC
  Administered 2018-04-23: 10:00:00 via INTRAVENOUS
  Filled 2018-04-23: qty 1000

## 2018-04-23 MED ORDER — POTASSIUM CHLORIDE 10 MEQ/100ML IV SOLN
10.0000 meq | INTRAVENOUS | Status: AC
  Administered 2018-04-23 (×4): 10 meq via INTRAVENOUS
  Filled 2018-04-23 (×4): qty 100

## 2018-04-23 MED ORDER — POTASSIUM CHLORIDE 20 MEQ PO PACK
40.0000 meq | PACK | Freq: Once | ORAL | Status: AC
  Administered 2018-04-23: 40 meq via ORAL
  Filled 2018-04-23: qty 2

## 2018-04-23 MED ORDER — DEXTROSE IN LACTATED RINGERS 5 % IV SOLN
INTRAVENOUS | Status: DC
  Administered 2018-04-23 – 2018-04-25 (×3): via INTRAVENOUS

## 2018-04-23 NOTE — Progress Notes (Addendum)
[facility] Office:  [phone] General Surgery Progress Note   LOS: 2 days  POD -     Chief Complaint: Abdominal pain  Assessment and Plan: 1.  Recurrent SBO  KUB yesterday looks better - today's KUB pending  Feels good, but no flatus KUB today shows contrast in colon with SB decompressed.  Because she has been difficult to pass NGT, will start clear liquids with NGT in place.  2.  HTN x 7 years 3.  History of breast cancer - 1994 4.  History of hysterectomy/BSO - 1997             For uterine cancer 5.  History of diverticulitis - 2012 6.  Left lower renal stone 7.  DVT prophylaxis - Lovenox 8. Hypokalemia - K+ 3.1 - 04/23/2018  I changed maintenance fluids with added K+   Active Problems:   SBO (small bowel obstruction) (HCC)   Essential hypertension   Hyponatremia   Small bowel obstruction (HCC)  Subjective:  Feels okay. No BM.  No nausea.  She did get up and walk around the halls yesterday.  Objective:   Vitals:   04/23/18 0536 04/23/18 0627  BP: (!) 167/77 (!) 163/80  Pulse: 63   Resp: 14   Temp: 98.6 F (37 C)   SpO2: 99%      Intake/Output from previous day:  03/07 0701 - 03/08 0700 In: 1196.4 [I.V.:1146.4; IV Piggyback:50] Out: 2050 [Urine:900; Emesis/NG output:1150]  Intake/Output this shift:  No intake/output data recorded.   Physical Exam:   General: Older WF who is alert and oriented.    HEENT: Normal. Pupils equal.  NGT in place. .   Lungs: Clear.   Abdomen: Soft.  Has BS.    Lab Results:    Recent Labs    04/21/18 1800 04/22/18 0422  WBC 9.5 7.6  HGB 12.2 12.0  HCT 39.3 39.5  PLT 333 326    BMET   Recent Labs    04/22/18 0422 04/23/18 0350  NA 135 134*  K 3.5 3.1*  CL 104 107  CO2 22 23  GLUCOSE 131* 119*  BUN 13 9  CREATININE 0.99 0.84  CALCIUM 8.8* 8.2*    PT/INR  No results for input(s): LABPROT, INR in the last 72 hours.  ABG  No results for input(s): PHART, HCO3 in the last 72 hours.  Invalid  input(s): PCO2, PO2   Studies/Results:  Ct Abdomen Pelvis W Contrast  Result Date: 04/21/2018 CLINICAL DATA:  Small bowel obstruction. Personal history of uterine cancer and left breast cancer. EXAM: CT ABDOMEN AND PELVIS WITH CONTRAST TECHNIQUE: Multidetector CT imaging of the abdomen and pelvis was performed using the standard protocol following bolus administration of intravenous contrast. CONTRAST:  <See Chart> ISOVUE-300 IOPAMIDOL (ISOVUE-300) INJECTION 61% COMPARISON:  CT of the abdomen and pelvis 04/13/2018. One-view abdomen 04/16/2018. FINDINGS: Lower chest: Focal atelectasis is again noted base. At the left the heart is in the left side of the chest. The patient is status post left mastectomy. Right lung bases clear. Hepatobiliary: No focal liver abnormality is seen. No gallstones, gallbladder wall thickening, or biliary dilatation. Pancreas: Unremarkable. No pancreatic ductal dilatation or surrounding inflammatory changes. Spleen: Normal in size without focal abnormality. Adrenals/Urinary Tract: Adrenal glands are normal bilaterally. A punctate nonobstructing stone is present at the lower pole of the left kidney. Kidneys and ureters are otherwise normal. The urinary bladder is within normal limits. Stomach/Bowel: Stomach is distended. Proximal bowel loops are distended up to  4 cm. Focal obstruction is again noted in the left lower quadrant. The more distal bowel is collapsed. Diverticular changes are present throughout the colon. There is some contrast throughout the colon. The colon is mostly collapsed. Vascular/Lymphatic: Atherosclerotic calcifications are present in the aorta and branch vessels. There is no aneurysm. Reproductive: Status post hysterectomy. No adnexal masses. Other: A small amount of free fluid is present. There is fluid about the liver and in the right pericolic gutter. Musculoskeletal: Vertebral body heights are preserved. There is some straightening of the normal lumbar lordosis.  Focal rightward curvature of the lumbar spine is centered at L2. Endplate changes are noted. Multilevel facet degenerative changes are present. Bony pelvis is within normal limits. Hips are located and unremarkable. IMPRESSION: 1. Recurrent/residual distal small bowel obstruction in the left lower quadrant. No definite obstructing mass lesion is present. 2. Abdominal ascites with fluid around the liver and into the anatomic pelvis is likely reactive. 3. Extensive colonic diverticulosis without focal inflammatory change to suggest diverticulitis. 4.  Aortic Atherosclerosis (ICD10-I70.0). 5. Punctate nonobstructing stone at the lower pole of the left kidney. 6. Degenerative changes in the lumbar spine. Electronically Signed   By: San Morelle M.D.   On: 04/21/2018 18:32   Dg Abd Portable 1v-small Bowel Obstruction Protocol-initial, 8 Hr Delay  Result Date: 04/22/2018 CLINICAL DATA:  Small-bowel obstruction. EXAM: PORTABLE ABDOMEN - 1 VIEW COMPARISON:  04/21/2018. FINDINGS: Nasogastric to tip in the region of the gastric pylorus. Injected contrast in the stomach with dilute contrast in mildly dilated small bowel loops. These do not appear as dilated as on the CT yesterday. Dense retained barium in a large number of colon diverticula and in the rectosigmoid colon. There is also some excreted contrast in the urinary bladder. Dextroconvex lumbar scoliosis and degenerative changes. IMPRESSION: Improving partial small bowel obstruction. Electronically Signed   By: Claudie Revering M.D.   On: 04/22/2018 10:07   Dg Abd Portable 1v-small Bowel Protocol-position Verification  Result Date: 04/22/2018 CLINICAL DATA:  NG tube placement. EXAM: PORTABLE ABDOMEN - 1 VIEW COMPARISON:  CT earlier this day FINDINGS: Tip and side port of the enteric tube below the diaphragm in the stomach. Dilated small bowel in CT is fluid-filled and not well seen by CT. Excreted IV contrast in the renal collecting systems from CT. Retained  contrast within multiple diverticula in the colon. IMPRESSION: Tip and side port of the enteric tube below the diaphragm in the stomach. Electronically Signed   By: Keith Rake M.D.   On: 04/22/2018 00:12     Anti-infectives:   Anti-infectives (From admission, onward)   None      Alphonsa Overall, MD, FACS Pager: [facility] Office: [phone] 04/23/2018

## 2018-04-23 NOTE — Progress Notes (Signed)
PROGRESS NOTE    Chelsea Petersen  JYN:[id number] DOB: 03-04-1940 DOA: 04/21/2018 PCP: Chelsea Prey, NP    Brief Narrative:  78 year old female who presented with abdominal distention and nausea.  She does have significant past medical history for breast cancer, and uterine cancer status post hysterectomy.  Apparently she was discharged about 4 days ago, treated for small bowel obstruction due to adhesions.  Was managed medically.  2 days later she developed gradual, worsening abdominal distention, associated with nausea and vomiting.  Reported small-volume bowel movements and scant flatus.  Initial physical examination blood pressure 155/81, pulse rate 90, respiratory rate 17, temperature 98.1, oxygen saturation 98%, she had moist mucous membranes, her lungs were clear to auscultation bilaterally, heart S1-S2 present and rhythmic, the abdomen had moderate distention, bowel sounds were absent and decreased, no tenderness, no lower extremity edema.  CT of the abdomen showed recurrent/residual distal small bowel obstruction in the left lower quadrant.  Patient was admitted to the hospital with the working diagnosis of recurrent small bowel obstruction.  Assessment & Plan:   Active Problems:   SBO (small bowel obstruction) (HCC)   Essential hypertension   Hyponatremia   Small bowel obstruction (HCC)  1. Recurrent small bowel obstruction.  Clinically patient continue improving, she still has NG tube in place (1,150 ml output over last 24 H), will continue supportive IV fluids, IV analgesics, IV antiacids and antiemetics. She is having bowel movements this am. Will follow with surgery recommendations. Abdominal films today personally reviewed, noted advancement of oral contrast, and positive small foci of air fluid levels on the left. Will follow with surgery recommendations.   2. HTN. Will continue to hold oral antihypertensive agents, for now. Her blood pressure systolic is 155 mmHg today. At  home is on lisinopril.  3. Hyponatremia. Serum Na at 134, with K at 3,1 and serum bicarbonate at 23, will continue K correction with Kcl IV and follow with renal panel in am. Preserved renal function with serum cr at 0.84, will continue hydration with balanced electrolyte solutions and dextrose.   4. Chronic pain syndrome. Continue pain control with as needed morphine.    DVT prophylaxis: enoxaparin   Code Status: full Family Communication: I spoke with patient's family at the bedside and all questions were addressed.  Disposition Plan/ discharge barriers: pending clinical improvement.   Body mass index is 21.87 kg/m. Malnutrition Type:      Malnutrition Characteristics:      Nutrition Interventions:     RN Pressure Injury Documentation:     Consultants:   Surgery   Procedures:     Antimicrobials:       Subjective: Patient is feeling better, still has NG tube in place, no nausea or vomiting, this am had bowel movement, no chest pain or dyspnea.   Objective: Vitals:   04/22/18 1335 04/22/18 2106 04/23/18 0536 04/23/18 0627  BP: (!) 154/78 (!) 155/82 (!) 167/77 (!) 163/80  Pulse: 73 78 63   Resp: 16 14 14    Temp: 99.6 F (37.6 C) 98.7 F (37.1 C) 98.6 F (37 C)   TempSrc: Oral Oral Oral   SpO2: 98% 100% 99%   Weight:      Height:        Intake/Output Summary (Last 24 hours) at 04/23/2018 1232 Last data filed at 04/23/2018 1020 Gross per 24 hour  Intake 1254.51 ml  Output 1550 ml  Net -295.49 ml   Filed Weights   04/21/18 2314  Weight: 50.8 kg  Examination:   General: deconditioned  Neurology: Awake and alert, non focal  E ENT: mild pallor, no icterus, oral mucosa moist Cardiovascular: No JVD. S1-S2 present, rhythmic, no gallops, rubs, or murmurs. No lower extremity edema. Pulmonary: positive breath sounds bilaterally, adequate air movement, no wheezing, rhonchi or rales. Gastrointestinal. Abdomen mild distended with no organomegaly, non  tender, no rebound or guarding Skin. No rashes Musculoskeletal: no joint deformities     Data Reviewed: I have personally reviewed following labs and imaging studies  CBC: Recent Labs  Lab 04/21/18 1800 04/22/18 0422  WBC 9.5 7.6  NEUTROABS 7.2 5.5  HGB 12.2 12.0  HCT 39.3 39.5  MCV 98.7 100.0  PLT 333 326   Basic Metabolic Panel: Recent Labs  Lab 04/21/18 1636 04/21/18 1642 04/22/18 0422 04/23/18 0350  NA 131*  --  135 134*  K 4.2  --  3.5 3.1*  CL 100  --  104 107  CO2 22  --  22 23  GLUCOSE 100*  --  131* 119*  BUN 13  --  13 9  CREATININE 0.99 1.00 0.99 0.84  CALCIUM 9.5  --  8.8* 8.2*   GFR: Estimated Creatinine Clearance: 39 mL/min (by C-G formula based on SCr of 0.84 mg/dL). Liver Function Tests: Recent Labs  Lab 04/21/18 1636 04/22/18 0422  AST 51* 38  ALT 70* 56*  ALKPHOS 67 59  BILITOT 1.0 0.7  PROT 6.7 6.0*  ALBUMIN 4.0 3.6   Recent Labs  Lab 04/21/18 1636  LIPASE 73*   No results for input(s): AMMONIA in the last 168 hours. Coagulation Profile: No results for input(s): INR, PROTIME in the last 168 hours. Cardiac Enzymes: No results for input(s): CKTOTAL, CKMB, CKMBINDEX, TROPONINI in the last 168 hours. BNP (last 3 results) No results for input(s): PROBNP in the last 8760 hours. HbA1C: No results for input(s): HGBA1C in the last 72 hours. CBG: Recent Labs  Lab 04/16/18 1606 04/17/18 0557 04/17/18 1608 04/23/18 0754  GLUCAP 139* 98 106* 115*   Lipid Profile: No results for input(s): CHOL, HDL, LDLCALC, TRIG, CHOLHDL, LDLDIRECT in the last 72 hours. Thyroid Function Tests: Recent Labs    04/22/18 0422  TSH 2.101   Anemia Panel: No results for input(s): VITAMINB12, FOLATE, FERRITIN, TIBC, IRON, RETICCTPCT in the last 72 hours.    Radiology Studies: I have reviewed all of the imaging during this hospital visit personally     Scheduled Meds: . enoxaparin (LOVENOX) injection  40 mg Subcutaneous Daily  . lidocaine  1  application Other Once   Continuous Infusions: . dextrose 5 % and 0.45 % NaCl with KCl 20 mEq/L Stopped (04/23/18 1019)  . famotidine (PEPCID) IV 20 mg (04/23/18 1020)     LOS: 2 days        [name], MD

## 2018-04-23 NOTE — Progress Notes (Signed)
NG clamped and pt started on clear liquid diet

## 2018-04-24 LAB — CBC WITH DIFFERENTIAL/PLATELET
Abs Immature Granulocytes: 0.03 10*3/uL (ref 0.00–0.07)
Basophils Absolute: 0 10*3/uL (ref 0.0–0.1)
Basophils Relative: 1 %
Eosinophils Absolute: 0.2 10*3/uL (ref 0.0–0.5)
Eosinophils Relative: 3 %
HCT: 35.3 % — ABNORMAL LOW (ref 36.0–46.0)
HEMOGLOBIN: 10.8 g/dL — AB (ref 12.0–15.0)
Immature Granulocytes: 0 %
Lymphocytes Relative: 18 %
Lymphs Abs: 1.4 10*3/uL (ref 0.7–4.0)
MCH: 30.4 pg (ref 26.0–34.0)
MCHC: 30.6 g/dL (ref 30.0–36.0)
MCV: 99.4 fL (ref 80.0–100.0)
Monocytes Absolute: 0.8 10*3/uL (ref 0.1–1.0)
Monocytes Relative: 10 %
Neutro Abs: 5.3 10*3/uL (ref 1.7–7.7)
Neutrophils Relative %: 68 %
Platelets: 303 10*3/uL (ref 150–400)
RBC: 3.55 MIL/uL — ABNORMAL LOW (ref 3.87–5.11)
RDW: 11.9 % (ref 11.5–15.5)
WBC: 7.7 10*3/uL (ref 4.0–10.5)
nRBC: 0 % (ref 0.0–0.2)

## 2018-04-24 LAB — BASIC METABOLIC PANEL
Anion gap: 5 (ref 5–15)
CO2: 23 mmol/L (ref 22–32)
Calcium: 8.8 mg/dL — ABNORMAL LOW (ref 8.9–10.3)
Chloride: 107 mmol/L (ref 98–111)
Creatinine, Ser: 0.78 mg/dL (ref 0.44–1.00)
GFR calc Af Amer: >60 mL/min (ref >60)
GFR calc non Af Amer: >60 mL/min (ref >60)
Glucose, Bld: 113 mg/dL — ABNORMAL HIGH (ref 70–99)
Potassium: 4 mmol/L (ref 3.5–5.1)
Sodium: 135 mmol/L (ref 135–145)

## 2018-04-24 LAB — GLUCOSE, CAPILLARY
Glucose-Capillary: 85 mg/dL (ref 70–99)
Glucose-Capillary: 92 mg/dL (ref 70–99)
Glucose-Capillary: 93 mg/dL (ref 70–99)
Glucose-Capillary: 81 mg/dL (ref 70–99)

## 2018-04-24 MED ORDER — LISINOPRIL 10 MG PO TABS
10.0000 mg | ORAL_TABLET | Freq: Every day | ORAL | Status: DC
  Administered 2018-04-24: 10 mg via ORAL
  Filled 2018-04-24: qty 1

## 2018-04-24 NOTE — Progress Notes (Signed)
    CC: Abdominal distention nausea and vomiting  Subjective: Patient is up walking the halls.  Nausea and vomiting has resolved.  Her NG tube is been clamped since yesterday.  She did have a bowel movement she is passing gas.  She still feels slightly distended on exam, but no discomfort.  She is ready to take the NG out.  Objective: Vital signs in last 24 hours: Temp:  [98.4 F (36.9 C)-98.8 F (37.1 C)] 98.5 F (36.9 C) (03/09 0601) Pulse Rate:  [66-84] 84 (03/09 0649) Resp:  [16-18] 16 (03/09 0649) BP: (153-176)/(69-83) 153/69 (03/09 0649) SpO2:  [96 %-100 %] 96 % (03/09 0601) Last BM Date: 04/23/18 240 p.o. recorded 1379 IV 2100 urine 100 from the NG first shift No BM recorded Afebrile vital signs are stable Electrolytes are normal, WBC 7.7, hemoglobin 10.8, hematocrit 35.3, platelets 303,000  Intake/Output from previous day: 03/08 0701 - 03/09 0700 In: 1619.5 [P.O.:240; I.V.:1379.5] Out: 2200 [Urine:2100; Emesis/NG output:100] Intake/Output this shift: Total I/O In: 120 [P.O.:120] Out: 0   General appearance: alert, cooperative and no distress Resp: clear to auscultation bilaterally GI: Soft, nontender, some distention.  She is up in the halls walking.  She has had some clears not a lot.  Positive flatus, positive BM x1 yesterday.  Lab Results:  Recent Labs    04/22/18 0422 04/24/18 0355  WBC 7.6 7.7  HGB 12.0 10.8*  HCT 39.5 35.3*  PLT 326 303    BMET Recent Labs    04/23/18 0350 04/24/18 0355  NA 134* 135  K 3.1* 4.0  CL 107 107  CO2 23 23  GLUCOSE 119* 113*  BUN 9 <5*  CREATININE 0.84 0.78  CALCIUM 8.2* 8.8*   PT/INR No results for input(s): LABPROT, INR in the last 72 hours.  Recent Labs  Lab 04/21/18 1636 04/22/18 0422  AST 51* 38  ALT 70* 56*  ALKPHOS 67 59  BILITOT 1.0 0.7  PROT 6.7 6.0*  ALBUMIN 4.0 3.6     Lipase     Component Value Date/Time   LIPASE 73 (H) 04/21/2018 1636     Medications: . enoxaparin (LOVENOX)  injection  40 mg Subcutaneous Daily  . lidocaine  1 application Other Once   . dextrose 5% lactated ringers 75 mL/hr at 04/24/18 0200  . famotidine (PEPCID) IV 20 mg (04/23/18 1020)    Assessment/Plan Hypertension Hx breast cancer 1994 Hx hysterectomy/BSSO -uterine cancer 1997 Hx diverticulitis -2012 Hx left nephrolithiasis Hypokalemia -replaced/resolved  Recurrent SBO (hospitalized 2/28 -04/17/18) -readmit 04/21/2018   FEN: IV fluids/clears ID: None DVT: Lovenox Follow-up: To be determined   Plan: We will pull her NG and advance her to full liquids.  She was on clears yesterday but has not taken a great deal I told her just to go slow and advance as she would tolerate it.  I am going to recheck her magnesium and potassium tomorrow, just to make sure electrolytes are stabilized.     LOS: 3 days    Hardeep Reetz 04/24/2018 [phone]

## 2018-04-24 NOTE — Care Management Important Message (Signed)
Important Message  Patient Details  Name: Chelsea Petersen MRN: [id number] Date of Birth: 11/28/1940   Medicare Important Message Given:  Yes    Scotti Motter 04/24/2018, 9:08 AM

## 2018-04-24 NOTE — Progress Notes (Signed)
PROGRESS NOTE                                                                                                                                                                                                             Patient Demographics:    Chelsea Petersen, is a 78 y.o. female, DOB - 1940-04-30, MVH:[id number]  Admit date - 04/21/2018   Admitting Physician Kathrynn Running, MD  Outpatient Primary MD for the patient is Vernon Prey, NP  LOS - 3  Outpatient Specialists: None  Chief Complaint  Patient presents with  . abdominal distention  . Nausea       Brief Narrative   78 year old female with hypertension, remote history of breast cancer and uterine cancer status post hysterectomy and history of diverticulitis who was hospitalized recently for small bowel obstruction, thought to be mechanical secondary to adhesions, managed conservatively and discharged home return back 4 days later with increasing abdominal distention with nausea and vomiting.  Found to have recurrent small bowel obstruction.    Subjective:   Denies abdominal pain and reports her distention to be getting better.  NG clamped this morning and tolerating clear liquid.   Assessment  & Plan :    Active Problems: Recurrent SBO (small bowel obstruction) (HCC) Being managed conservatively.  Tolerating clears.  NG being removed and diet advanced. Surgery following.  Potassium replenished.  Recheck electrolytes and a.m.  Hyponatremia/hypokalemia Replenished Recheck in a.m.  Essential hypertension Resume lisinopril.     Code Status : Full code  Family Communication  : None at bedside  Disposition Plan  : Home possibly in 24-48 hours if continues to improve  Barriers For Discharge : Active symptoms  Consults  : CCS  Procedures  : CT abdomen and pelvis  DVT Prophylaxis  : Lovenox  Lab Results  Component Value Date   PLT 303  04/24/2018    Antibiotics  :    Anti-infectives (From admission, onward)   None        Objective:   Vitals:   04/23/18 1341 04/23/18 2129 04/24/18 0601 04/24/18 0649  BP: (!) 155/76 (!) 176/83 (!) 167/76 (!) 153/69  Pulse: 66 68 72 84  Resp: 16 18 16 16   Temp: 98.4 F (36.9 C) 98.8 F (37.1 C) 98.5 F (36.9 C)   TempSrc: Oral Oral Oral  SpO2: 100% 98% 96%   Weight:      Height:        Wt Readings from Last 3 Encounters:  04/21/18 50.8 kg  04/14/18 51.5 kg     Intake/Output Summary (Last 24 hours) at 04/24/2018 1104 Last data filed at 04/24/2018 0955 Gross per 24 hour  Intake 1235 ml  Output 2100 ml  Net -865 ml     Physical Exam  Gen: not in distress, NG clamped HEENT: no pallor, moist mucosa, supple neck Chest: clear b/l, no added sounds CVS: N S1&S2, no murmurs,  GI: soft, nondistended, bowel sounds present, nontender Musculoskeletal: warm, no edema     Data Review:    CBC Recent Labs  Lab 04/21/18 1800 04/22/18 0422 04/24/18 0355  WBC 9.5 7.6 7.7  HGB 12.2 12.0 10.8*  HCT 39.3 39.5 35.3*  PLT 333 326 303  MCV 98.7 100.0 99.4  MCH 30.7 30.4 30.4  MCHC 31.0 30.4 30.6  RDW 12.0 12.1 11.9  LYMPHSABS 1.3 1.3 1.4  MONOABS 0.8 0.7 0.8  EOSABS 0.0 0.0 0.2  BASOSABS 0.0 0.0 0.0    Chemistries  Recent Labs  Lab 04/21/18 1636 04/21/18 1642 04/22/18 0422 04/23/18 0350 04/24/18 0355  NA 131*  --  135 134* 135  K 4.2  --  3.5 3.1* 4.0  CL 100  --  104 107 107  CO2 22  --  22 23 23   GLUCOSE 100*  --  131* 119* 113*  BUN 13  --  13 9 <5*  CREATININE 0.99 1.00 0.99 0.84 0.78  CALCIUM 9.5  --  8.8* 8.2* 8.8*  AST 51*  --  38  --   --   ALT 70*  --  56*  --   --   ALKPHOS 67  --  59  --   --   BILITOT 1.0  --  0.7  --   --    ------------------------------------------------------------------------------------------------------------------ No results for input(s): CHOL, HDL, LDLCALC, TRIG, CHOLHDL, LDLDIRECT in the last 72 hours.  No  results found for: HGBA1C ------------------------------------------------------------------------------------------------------------------ Recent Labs    04/22/18 0422  TSH 2.101   ------------------------------------------------------------------------------------------------------------------ No results for input(s): VITAMINB12, FOLATE, FERRITIN, TIBC, IRON, RETICCTPCT in the last 72 hours.  Coagulation profile No results for input(s): INR, PROTIME in the last 168 hours.  No results for input(s): DDIMER in the last 72 hours.  Cardiac Enzymes No results for input(s): CKMB, TROPONINI, MYOGLOBIN in the last 168 hours.  Invalid input(s): CK ------------------------------------------------------------------------------------------------------------------ No results found for: BNP  Inpatient Medications  Scheduled Meds: . enoxaparin (LOVENOX) injection  40 mg Subcutaneous Daily  . lidocaine  1 application Other Once   Continuous Infusions: . dextrose 5% lactated ringers 75 mL/hr at 04/24/18 0200  . famotidine (PEPCID) IV 20 mg (04/24/18 0932)   PRN Meds:.hydrALAZINE, morphine injection, ondansetron (ZOFRAN) IV  Micro Results No results found for this or any previous visit (from the past 240 hour(s)).  Radiology Reports Ct Abdomen Pelvis Wo Contrast  Result Date: 04/13/2018 CLINICAL DATA:  Lower abdominal pain with nausea and vomiting for 2 days. EXAM: CT ABDOMEN AND PELVIS WITHOUT CONTRAST TECHNIQUE: Multidetector CT imaging of the abdomen and pelvis was performed following the standard protocol without IV contrast. COMPARISON:  Ultrasound kidneys 04/13/2018 FINDINGS: Lower chest: Atelectasis in the lung bases. 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 Coronary artery calcifications. Mild cardiac enlargement. Hepatobiliary: No focal liver lesions. Gallbladder is contracted but otherwise normal. No bile duct  dilatation. Pancreas: Unremarkable. No pancreatic ductal  dilatation or surrounding inflammatory changes. Spleen: Normal in size without focal abnormality. Adrenals/Urinary Tract: No adrenal gland nodules. Stone in the lower pole left kidney measuring 3 mm diameter. No hydronephrosis or hydroureter. Bladder is unremarkable. Stomach/Bowel: Market distention of fluid-filled stomach and small bowel. Distal small bowel are decompressed. Transition zone appears to be in the left lower quadrant. No cause is identified, suggesting probable adhesions. Appearance suggest high-grade obstruction. Scattered stool throughout the colon without colonic distention. Colonic diverticulosis without evidence of diverticulitis. Appendix is normal. Vascular/Lymphatic: Extensive aortic and vascular calcifications. Reproductive: Status post hysterectomy. No adnexal masses. Other: Small amount of free fluid in the upper abdomen, likely ascites or reactive fluid. No free air. Abdominal wall musculature appears intact. Musculoskeletal: Degenerative changes in the lumbar spine. Mild scoliosis convex towards the right, likely degenerative. IMPRESSION: 1. High-grade small bowel obstruction with transition zone in the left lower quadrant. No cause is identified, suggesting adhesions. 2. Small amount of free fluid in the abdomen, likely ascites or reactive fluid. 3. Nonobstructing stone in the lower pole left kidney. 4. Colonic diverticulosis without evidence of diverticulitis. Aortic Atherosclerosis (ICD10-I70.0). Electronically Signed   By: Burman Nieves M.D.   On: 04/13/2018 22:49   Ct Abdomen Pelvis W Contrast  Result Date: 04/21/2018 CLINICAL DATA:  Small bowel obstruction. Personal history of uterine cancer and left breast cancer. EXAM: CT ABDOMEN AND PELVIS WITH CONTRAST TECHNIQUE: Multidetector CT imaging of the abdomen and pelvis was performed using the standard protocol following bolus administration of intravenous contrast. CONTRAST:  <See Chart> ISOVUE-300 IOPAMIDOL (ISOVUE-300)  INJECTION 61% COMPARISON:  CT of the abdomen and pelvis 04/13/2018. One-view abdomen 04/16/2018. FINDINGS: Lower chest: Focal atelectasis is again noted base. At the left the heart is in the left side of the chest. The patient is status post left mastectomy. Right lung bases clear. Hepatobiliary: No focal liver abnormality is seen. No gallstones, gallbladder wall thickening, or biliary dilatation. Pancreas: Unremarkable. No pancreatic ductal dilatation or surrounding inflammatory changes. Spleen: Normal in size without focal abnormality. Adrenals/Urinary Tract: Adrenal glands are normal bilaterally. A punctate nonobstructing stone is present at the lower pole of the left kidney. Kidneys and ureters are otherwise normal. The urinary bladder is within normal limits. Stomach/Bowel: Stomach is distended. Proximal bowel loops are distended up to 4 cm. Focal obstruction is again noted in the left lower quadrant. The more distal bowel is collapsed. Diverticular changes are present throughout the colon. There is some contrast throughout the colon. The colon is mostly collapsed. Vascular/Lymphatic: Atherosclerotic calcifications are present in the aorta and branch vessels. There is no aneurysm. Reproductive: Status post hysterectomy. No adnexal masses. Other: A small amount of free fluid is present. There is fluid about the liver and in the right pericolic gutter. Musculoskeletal: Vertebral body heights are preserved. There is some straightening of the normal lumbar lordosis. Focal rightward curvature of the lumbar spine is centered at L2. Endplate changes are noted. Multilevel facet degenerative changes are present. Bony pelvis is within normal limits. Hips are located and unremarkable. IMPRESSION: 1. Recurrent/residual distal small bowel obstruction in the left lower quadrant. No definite obstructing mass lesion is present. 2. Abdominal ascites with fluid around the liver and into the anatomic pelvis is likely reactive. 3.  Extensive colonic diverticulosis without focal inflammatory change to suggest diverticulitis. 4.  Aortic Atherosclerosis (ICD10-I70.0). 5. Punctate nonobstructing stone at the lower pole of the left kidney. 6. Degenerative changes in the lumbar spine. Electronically Signed  By: Marin Roberts M.D.   On: 04/21/2018 18:32   US Renal  Result Date: 04/13/2018 CLINICAL DATA:  Renal failure EXAM: RENAL / URINARY TRACT ULTRASOUND COMPLETE COMPARISON:  None. FINDINGS: Right Kidney: Renal measurements: 9.5 x 5.2 x 5.1 cm = volume: 132 mL . Echogenicity within normal limits. No mass or hydronephrosis visualized. Left Kidney: Renal measurements: 9.4 x 4.6 x 4.0 cm = volume: 91 mL. Echogenicity within normal limits. No mass or hydronephrosis visualized. Small echogenic foci within the left kidney, the largest 5 mm in the lower pole, likely nonobstructing stones. Bladder: Decompressed, grossly unremarkable. IMPRESSION: No acute findings.  No hydronephrosis. Suspect small nonobstructing left renal stones. Electronically Signed   By: Charlett Nose M.D.   On: 04/13/2018 19:06   Dg Abd 2 Views  Result Date: 04/23/2018 CLINICAL DATA:  Follow-up ileus EXAM: ABDOMEN - 2 VIEW COMPARISON:  March 7 1,020 FINDINGS: The NG tube terminates in stable position, likely in the gastric antrum. No large or small bowel dilatation. Contrast is now seen throughout the colon. No other acute abnormalities. IMPRESSION: No evidence of ileus on today's study.  Stable NG tube placement. Electronically Signed   By: Gerome Sam III M.D   On: 04/23/2018 15:45   Dg Abd Portable 1v-small Bowel Obstruction Protocol-initial, 8 Hr Delay  Result Date: 04/22/2018 CLINICAL DATA:  Small-bowel obstruction. EXAM: PORTABLE ABDOMEN - 1 VIEW COMPARISON:  04/21/2018. FINDINGS: Nasogastric to tip in the region of the gastric pylorus. Injected contrast in the stomach with dilute contrast in mildly dilated small bowel loops. These do not appear as dilated  as on the CT yesterday. Dense retained barium in a large number of colon diverticula and in the rectosigmoid colon. There is also some excreted contrast in the urinary bladder. Dextroconvex lumbar scoliosis and degenerative changes. IMPRESSION: Improving partial small bowel obstruction. Electronically Signed   By: Beckie Salts M.D.   On: 04/22/2018 10:07   Dg Abd Portable 1v-small Bowel Protocol-position Verification  Result Date: 04/22/2018 CLINICAL DATA:  NG tube placement. EXAM: PORTABLE ABDOMEN - 1 VIEW COMPARISON:  CT earlier this day FINDINGS: Tip and side port of the enteric tube below the diaphragm in the stomach. Dilated small bowel in CT is fluid-filled and not well seen by CT. Excreted IV contrast in the renal collecting systems from CT. Retained contrast within multiple diverticula in the colon. IMPRESSION: Tip and side port of the enteric tube below the diaphragm in the stomach. Electronically Signed   By: Narda Rutherford M.D.   On: 04/22/2018 00:12   Dg Abd Portable 1v  Result Date: 04/16/2018 CLINICAL DATA:  Small-bowel obstruction. EXAM: PORTABLE ABDOMEN - 1 VIEW COMPARISON:  04/15/2018; abdominal CT-04/13/2018 FINDINGS: Persistent though improved mild distension of several patulous loops of small bowel with index loop of small bowel in the left mid hemiabdomen measuring 2.5 cm in diameter, previously, 3.7 cm. This finding is associated with potential bowel wall thickening. Enteric contrast remains within the colon, in a similar location compared to the 04/15/2018 examination. Enteric tube tip and side port again project over the expected location of the gastric fundus. Nondiagnostic evaluation for pneumoperitoneum secondary to supine positioning and exclusion of the lower thorax. No pneumatosis or portal venous gas. Degenerative change of the lower lumbar spine, incompletely evaluated. IMPRESSION: Findings most suggestive of slightly improved though persistent small-bowel obstruction with  slight reduction of gaseous distention of the small bowel though with persistent suspected small bowel wall thickening and unchanged location of ingested enteric  contrast within the colon. Electronically Signed   By: Simonne Come M.D.   On: 04/16/2018 06:29   Dg Abd Portable 1v  Result Date: 04/15/2018 CLINICAL DATA:  8 hour port abdomen EXAM: PORTABLE ABDOMEN - 1 VIEW COMPARISON:  04/15/2018 FINDINGS: Nasogastric tube is in place. There is residual contrast in the stomach and large bowel loops. There is persistent dilatation of small bowel loops, partially obscured by overlying contrast. The dilatation of small bowel appears slightly improved compared with most recent exam. Contrast is identified within the rectum, excluding complete obstruction. IMPRESSION: Suspect slight improvement in the partial small bowel obstruction. There is persistent dilatation of small bowel loops partially obscured by the contrast. Electronically Signed   By: Norva Pavlov M.D.   On: 04/15/2018 17:59   Dg Abd Portable 1v-small Bowel Obstruction Protocol-initial, 8 Hr Delay  Result Date: 04/15/2018 CLINICAL DATA:  Small bowel obstruction. 8 hour delay. EXAM: PORTABLE ABDOMEN - 1 VIEW COMPARISON:  04/14/2018 FINDINGS: Contrast material is demonstrated in the stomach, small bowel, and right colon. Presence of contrast material in the colon suggest partial small bowel obstruction. Small bowel remain dilated with evidence of wall thickening. Enteric tube in the left upper quadrant consistent with location in the upper stomach. Degenerative changes and scoliosis of the lumbar spine. IMPRESSION: Dilated small bowel with contrast material in the colon suggesting partial small bowel obstruction. Electronically Signed   By: Burman Nieves M.D.   On: 04/15/2018 02:34   Dg Vangie Bicker G Tube Plc W/fl W/rad  Result Date: 04/14/2018 CLINICAL DATA:  Unable to place a nasogastric tube on the floor due to proximal obstruction. The tube is being  placed for high-grade small bowel obstruction seen on a CT yesterday. EXAM: NASO G TUBE PLACEMENT WITH FL AND WITH RAD CONTRAST:  None used. FLUOROSCOPY TIME:  Fluoroscopy Time:  0 minutes 24 seconds Radiation Exposure Index (if provided by the fluoroscopic device): 3.0 mGy Number of Acquired Spot Images: 0 COMPARISON:  Abdomen and pelvis CT dated 04/13/2018 FINDINGS: Under fluoroscopic guidance, a nasogastric tube was passed through the patient's left nostril and into the stomach. The tip and side hole were left in the proximal stomach. The patient tolerated the procedure with no complications. The post placement image demonstrates lumbar spine degenerative changes and scoliosis. No dilated bowel loops are visible on that image with a paucity of loops containing gas. IMPRESSION: Successful fluoroscopic guided nasogastric tube placement, as described above. Electronically Signed   By: Beckie Salts M.D.   On: 04/14/2018 15:52    Time Spent in minutes  25   Reah Justo M.D on 04/24/2018 at 11:04 AM  Between 7am to 7pm - Pager - [phone]  After 7pm go to [email or web address] - password [facility]  [facility] -  Office  [phone]

## 2018-04-25 LAB — BASIC METABOLIC PANEL
Anion gap: 6 (ref 5–15)
BUN: <5 mg/dL — ABNORMAL LOW (ref 8–23)
CO2: 24 mmol/L (ref 22–32)
CREATININE: 0.75 mg/dL (ref 0.44–1.00)
Calcium: 8.8 mg/dL — ABNORMAL LOW (ref 8.9–10.3)
Chloride: 106 mmol/L (ref 98–111)
GFR calc Af Amer: >60 mL/min (ref >60)
GFR calc non Af Amer: >60 mL/min (ref >60)
Glucose, Bld: 102 mg/dL — ABNORMAL HIGH (ref 70–99)
Potassium: 3.4 mmol/L — ABNORMAL LOW (ref 3.5–5.1)
Sodium: 136 mmol/L (ref 135–145)

## 2018-04-25 LAB — MAGNESIUM: MAGNESIUM: 1.7 mg/dL (ref 1.7–2.4)

## 2018-04-25 LAB — GLUCOSE, CAPILLARY
GLUCOSE-CAPILLARY: 84 mg/dL (ref 70–99)
Glucose-Capillary: 86 mg/dL (ref 70–99)

## 2018-04-25 MED ORDER — RISAQUAD PO CAPS
1.0000 | ORAL_CAPSULE | Freq: Every day | ORAL | Status: DC
  Administered 2018-04-25 – 2018-04-26 (×2): 1 via ORAL
  Filled 2018-04-25 (×2): qty 1

## 2018-04-25 MED ORDER — ADULT MULTIVITAMIN W/MINERALS CH
1.0000 | ORAL_TABLET | Freq: Every day | ORAL | Status: DC
  Administered 2018-04-25 – 2018-04-26 (×2): 1 via ORAL
  Filled 2018-04-25 (×2): qty 1

## 2018-04-25 MED ORDER — POTASSIUM CHLORIDE CRYS ER 20 MEQ PO TBCR
20.0000 meq | EXTENDED_RELEASE_TABLET | Freq: Two times a day (BID) | ORAL | Status: DC
  Administered 2018-04-25 – 2018-04-26 (×3): 20 meq via ORAL
  Filled 2018-04-25 (×3): qty 1

## 2018-04-25 MED ORDER — PANTOPRAZOLE SODIUM 40 MG IV SOLR
40.0000 mg | INTRAVENOUS | Status: DC
  Administered 2018-04-26: 40 mg via INTRAVENOUS
  Filled 2018-04-25: qty 40

## 2018-04-25 MED ORDER — FLUTICASONE PROPIONATE 50 MCG/ACT NA SUSP
1.0000 | Freq: Every day | NASAL | Status: DC
  Filled 2018-04-25: qty 16

## 2018-04-25 MED ORDER — POTASSIUM CHLORIDE CRYS ER 20 MEQ PO TBCR
40.0000 meq | EXTENDED_RELEASE_TABLET | Freq: Once | ORAL | Status: AC
  Administered 2018-04-25: 40 meq via ORAL
  Filled 2018-04-25: qty 2

## 2018-04-25 MED ORDER — ACIDOPHILUS LACTOBACILLUS PO CAPS: ORAL_CAPSULE | Freq: Every day | ORAL | Status: DC

## 2018-04-25 MED ORDER — POLYETHYLENE GLYCOL 3350 17 G PO PACK
17.0000 g | PACK | Freq: Every day | ORAL | Status: DC
  Administered 2018-04-25 – 2018-04-26 (×2): 17 g via ORAL
  Filled 2018-04-25 (×2): qty 1

## 2018-04-25 MED ORDER — AZELASTINE HCL 0.1 % NA SOLN
1.0000 | Freq: Every day | NASAL | Status: DC
  Filled 2018-04-25: qty 30

## 2018-04-25 MED ORDER — LISINOPRIL 10 MG PO TABS
10.0000 mg | ORAL_TABLET | Freq: Every day | ORAL | Status: DC
  Administered 2018-04-25 – 2018-04-26 (×2): 10 mg via ORAL
  Filled 2018-04-25 (×2): qty 1

## 2018-04-25 MED ORDER — AZELASTINE-FLUTICASONE 137-50 MCG/ACT NA SUSP: 1.0000 | Freq: Every evening | NASAL | Status: DC

## 2018-04-25 MED ORDER — CELECOXIB 200 MG PO CAPS
200.0000 mg | ORAL_CAPSULE | Freq: Every day | ORAL | Status: DC
  Administered 2018-04-25 – 2018-04-26 (×2): 200 mg via ORAL
  Filled 2018-04-25 (×2): qty 1

## 2018-04-25 MED ORDER — MULTIVITAMINS PO CAPS: 1.0000 | ORAL_CAPSULE | Freq: Every day | ORAL | Status: DC

## 2018-04-25 MED ORDER — VITAMIN D 25 MCG (1000 UNIT) PO TABS
1000.0000 [IU] | ORAL_TABLET | Freq: Every day | ORAL | Status: DC
  Administered 2018-04-25: 1000 [IU] via ORAL
  Filled 2018-04-25 (×3): qty 1

## 2018-04-25 NOTE — Progress Notes (Signed)
    CC: Abdominal distention nausea and vomiting  Subjective: She is feeling better, tolerating full liquids.  No real BM yesterday, but had some smears of stool.  Abdomen is soft and non tender.  She looks good this AM also.    Objective: Vital signs in last 24 hours: Temp:  [98.2 F (36.8 C)-99 F (37.2 C)] 98.8 F (37.1 C) (03/10 0509) Pulse Rate:  [71-83] 83 (03/10 0509) Resp:  [16] 16 (03/10 0509) BP: (139-183)/(69-81) 164/72 (03/10 0509) SpO2:  [99 %-100 %] 99 % (03/10 0509) Last BM Date: 04/24/18 990 p.o. 900 IV 3500 urine No BM recorded Afebrile vital signs are stable her blood pressures up slightly. Potassium is 3.4-mag 1.7 Last film 3/8 showed no ileus.  Intake/Output from previous day: 03/09 0701 - 03/10 0700 In: 1940 [P.O.:990; I.V.:900; IV Piggyback:50] Out: 3500 [Urine:3500] Intake/Output this shift: Total I/O In: -  Out: 400 [Urine:400]  General appearance: alert, cooperative and no distress Resp: clear to auscultation bilaterally GI: soft, non-tender; bowel sounds normal; no masses,  no organomegaly and No real BM yesteday, just smears of stool a couple times  Lab Results:  Recent Labs    04/24/18 0355  WBC 7.7  HGB 10.8*  HCT 35.3*  PLT 303    BMET Recent Labs    04/24/18 0355 04/25/18 0424  NA 135 136  K 4.0 3.4*  CL 107 106  CO2 23 24  GLUCOSE 113* 102*  BUN <5* <5*  CREATININE 0.78 0.75  CALCIUM 8.8* 8.8*   PT/INR No results for input(s): LABPROT, INR in the last 72 hours.  Recent Labs  Lab 04/21/18 1636 04/22/18 0422  AST 51* 38  ALT 70* 56*  ALKPHOS 67 59  BILITOT 1.0 0.7  PROT 6.7 6.0*  ALBUMIN 4.0 3.6     Lipase     Component Value Date/Time   LIPASE 73 (H) 04/21/2018 1636     Medications: . enoxaparin (LOVENOX) injection  40 mg Subcutaneous Daily  . lidocaine  1 application Other Once  . lisinopril  10 mg Oral Daily   . dextrose 5% lactated ringers 75 mL/hr at 04/25/18 0143  . famotidine (PEPCID) IV 20  mg (04/24/18 0932)    Assessment/Plan Hypertension Hx breast cancer 1994 Hx hysterectomy/BSSO -uterine cancer 1997 Hx diverticulitis -2012 Hx left nephrolithiasis Hypokalemia -replaced/resolved  Recurrent SBO (hospitalized 2/28 -04/17/18) -readmit 04/21/2018   FEN: IV fluids/full liquids ID: None DVT: Lovenox Follow-up: To be determined   Plan:  Replace K+, continue full liquids, get her back on home medicines as close as we can here.  Decrease IV fluids, and see how she does She was only home 4 days after last visit hospitalization.      LOS: 4 days    Ryaan Vanwagoner 04/25/2018 [phone]

## 2018-04-25 NOTE — Progress Notes (Signed)
Pt's B/P 164/72,this morning. Pt refused to take her PRN Hydralazine. Pt said that medicine gave her headache.

## 2018-04-25 NOTE — Progress Notes (Signed)
PROGRESS NOTE                                                                                                                                                                                                             Patient Demographics:    Chelsea Petersen, is a 78 y.o. female, DOB - Apr 25, 1940, YQM:[id number]  Admit date - 04/21/2018   Admitting Physician Kathrynn Running, MD  Outpatient Primary MD for the patient is Vernon Prey, NP  LOS - 4  Outpatient Specialists: None  Chief Complaint  Patient presents with  . abdominal distention  . Nausea       Brief Narrative   78 year old female with hypertension, remote history of breast cancer and uterine cancer status post hysterectomy and history of diverticulitis who was hospitalized recently for small bowel obstruction, thought to be mechanical secondary to adhesions, managed conservatively and discharged home return back 4 days later with increasing abdominal distention with nausea and vomiting.  Found to have recurrent small bowel obstruction.    Subjective:   Abdominal pain better but still has not been able to have a bowel movement.  Tolerating full liquid.   Assessment  & Plan :    Active Problems: Recurrent SBO (small bowel obstruction) (HCC) Improving with conservative management.  Tolerating full liquid and now advance to soft.  Unable to have bowel movement.  Plan to add MiraLAX if unsuccessful.  Replenished low potassium. Surgery following closely  Hyponatremia/hypokalemia Replenished Repeat labs in a.m.  Essential hypertension Resume lisinopril.     Code Status : Full code  Family Communication  : None at bedside  Disposition Plan  : Home possibly tomorrow if tolerating advance diet and having bowel movement.  Barriers For Discharge : Active symptoms  Consults  : CCS  Procedures  : CT abdomen and pelvis  DVT Prophylaxis  :  Lovenox  Lab Results  Component Value Date   PLT 303 04/24/2018    Antibiotics  :    Anti-infectives (From admission, onward)   None        Objective:   Vitals:   04/24/18 2205 04/25/18 0132 04/25/18 0509 04/25/18 1403  BP: (!) 172/76 139/69 (!) 164/72 (!) 155/75  Pulse: 71 71 83 78  Resp: 16  16 16   Temp: 99 F (37.2 C)  98.8 F (37.1  C) 98.2 F (36.8 C)  TempSrc: Oral  Oral Oral  SpO2: 100%  99% 100%  Weight:      Height:        Wt Readings from Last 3 Encounters:  04/21/18 50.8 kg  04/14/18 51.5 kg     Intake/Output Summary (Last 24 hours) at 04/25/2018 1604 Last data filed at 04/25/2018 1449 Gross per 24 hour  Intake 1770 ml  Output 3400 ml  Net -1630 ml    Physical exam Not in distress HEENT: Moist mucosa, supple neck Chest: Clear bilaterally CVS: Normal S1-S2, no murmurs GI: Soft, nondistended, nontender, bowel sounds present Musculoskeletal: Warm, no edema     Data Review:    CBC Recent Labs  Lab 04/21/18 1800 04/22/18 0422 04/24/18 0355  WBC 9.5 7.6 7.7  HGB 12.2 12.0 10.8*  HCT 39.3 39.5 35.3*  PLT 333 326 303  MCV 98.7 100.0 99.4  MCH 30.7 30.4 30.4  MCHC 31.0 30.4 30.6  RDW 12.0 12.1 11.9  LYMPHSABS 1.3 1.3 1.4  MONOABS 0.8 0.7 0.8  EOSABS 0.0 0.0 0.2  BASOSABS 0.0 0.0 0.0    Chemistries  Recent Labs  Lab 04/21/18 1636 04/21/18 1642 04/22/18 0422 04/23/18 0350 04/24/18 0355 04/25/18 0424  NA 131*  --  135 134* 135 136  K 4.2  --  3.5 3.1* 4.0 3.4*  CL 100  --  104 107 107 106  CO2 22  --  22 23 23 24   GLUCOSE 100*  --  131* 119* 113* 102*  BUN 13  --  13 9 <5* <5*  CREATININE 0.99 1.00 0.99 0.84 0.78 0.75  CALCIUM 9.5  --  8.8* 8.2* 8.8* 8.8*  MG  --   --   --   --   --  1.7  AST 51*  --  38  --   --   --   ALT 70*  --  56*  --   --   --   ALKPHOS 67  --  59  --   --   --   BILITOT 1.0  --  0.7  --   --   --     ------------------------------------------------------------------------------------------------------------------ No results for input(s): CHOL, HDL, LDLCALC, TRIG, CHOLHDL, LDLDIRECT in the last 72 hours.  No results found for: HGBA1C ------------------------------------------------------------------------------------------------------------------ No results for input(s): TSH, T4TOTAL, T3FREE, THYROIDAB in the last 72 hours.  Invalid input(s): FREET3 ------------------------------------------------------------------------------------------------------------------ No results for input(s): VITAMINB12, FOLATE, FERRITIN, TIBC, IRON, RETICCTPCT in the last 72 hours.  Coagulation profile No results for input(s): INR, PROTIME in the last 168 hours.  No results for input(s): DDIMER in the last 72 hours.  Cardiac Enzymes No results for input(s): CKMB, TROPONINI, MYOGLOBIN in the last 168 hours.  Invalid input(s): CK ------------------------------------------------------------------------------------------------------------------ No results found for: BNP  Inpatient Medications  Scheduled Meds: . acidophilus  1 capsule Oral Daily  . fluticasone  1 spray Each Nare Q2000   And  . azelastine  1 spray Each Nare Q2000  . celecoxib  200 mg Oral Q breakfast  . cholecalciferol  1,000 Units Oral Daily  . enoxaparin (LOVENOX) injection  40 mg Subcutaneous Daily  . lidocaine  1 application Other Once  . lisinopril  10 mg Oral Daily  . multivitamin with minerals  1 tablet Oral Daily  . [START ON 04/26/2018] pantoprazole (PROTONIX) IV  40 mg Intravenous Q24H  . polyethylene glycol  17 g Oral Daily  . potassium chloride  20 mEq Oral BID  Continuous Infusions:  PRN Meds:.hydrALAZINE, morphine injection, ondansetron (ZOFRAN) IV  Micro Results No results found for this or any previous visit (from the past 240 hour(s)).  Radiology Reports Ct Abdomen Pelvis Wo Contrast  Result Date:  04/13/2018 CLINICAL DATA:  Lower abdominal pain with nausea and vomiting for 2 days. EXAM: CT ABDOMEN AND PELVIS WITHOUT CONTRAST TECHNIQUE: Multidetector CT imaging of the abdomen and pelvis was performed following the standard protocol without IV contrast. COMPARISON:  Ultrasound kidneys 04/13/2018 FINDINGS: Lower chest: Atelectasis in the lung bases. 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 Coronary artery calcifications. Mild cardiac enlargement. Hepatobiliary: No focal liver lesions. Gallbladder is contracted but otherwise normal. No bile duct dilatation. Pancreas: Unremarkable. No pancreatic ductal dilatation or surrounding inflammatory changes. Spleen: Normal in size without focal abnormality. Adrenals/Urinary Tract: No adrenal gland nodules. Stone in the lower pole left kidney measuring 3 mm diameter. No hydronephrosis or hydroureter. Bladder is unremarkable. Stomach/Bowel: Market distention of fluid-filled stomach and small bowel. Distal small bowel are decompressed. Transition zone appears to be in the left lower quadrant. No cause is identified, suggesting probable adhesions. Appearance suggest high-grade obstruction. Scattered stool throughout the colon without colonic distention. Colonic diverticulosis without evidence of diverticulitis. Appendix is normal. Vascular/Lymphatic: Extensive aortic and vascular calcifications. Reproductive: Status post hysterectomy. No adnexal masses. Other: Small amount of free fluid in the upper abdomen, likely ascites or reactive fluid. No free air. Abdominal wall musculature appears intact. Musculoskeletal: Degenerative changes in the lumbar spine. Mild scoliosis convex towards the right, likely degenerative. IMPRESSION: 1. High-grade small bowel obstruction with transition zone in the left lower quadrant. No cause is identified, suggesting adhesions. 2. Small amount of free fluid in the abdomen, likely ascites or reactive fluid. 3. Nonobstructing stone  in the lower pole left kidney. 4. Colonic diverticulosis without evidence of diverticulitis. Aortic Atherosclerosis (ICD10-I70.0). Electronically Signed   By: Burman Nieves M.D.   On: 04/13/2018 22:49   Ct Abdomen Pelvis W Contrast  Result Date: 04/21/2018 CLINICAL DATA:  Small bowel obstruction. Personal history of uterine cancer and left breast cancer. EXAM: CT ABDOMEN AND PELVIS WITH CONTRAST TECHNIQUE: Multidetector CT imaging of the abdomen and pelvis was performed using the standard protocol following bolus administration of intravenous contrast. CONTRAST:  <See Chart> ISOVUE-300 IOPAMIDOL (ISOVUE-300) INJECTION 61% COMPARISON:  CT of the abdomen and pelvis 04/13/2018. One-view abdomen 04/16/2018. FINDINGS: Lower chest: Focal atelectasis is again noted base. At the left the heart is in the left side of the chest. The patient is status post left mastectomy. Right lung bases clear. Hepatobiliary: No focal liver abnormality is seen. No gallstones, gallbladder wall thickening, or biliary dilatation. Pancreas: Unremarkable. No pancreatic ductal dilatation or surrounding inflammatory changes. Spleen: Normal in size without focal abnormality. Adrenals/Urinary Tract: Adrenal glands are normal bilaterally. A punctate nonobstructing stone is present at the lower pole of the left kidney. Kidneys and ureters are otherwise normal. The urinary bladder is within normal limits. Stomach/Bowel: Stomach is distended. Proximal bowel loops are distended up to 4 cm. Focal obstruction is again noted in the left lower quadrant. The more distal bowel is collapsed. Diverticular changes are present throughout the colon. There is some contrast throughout the colon. The colon is mostly collapsed. Vascular/Lymphatic: Atherosclerotic calcifications are present in the aorta and branch vessels. There is no aneurysm. Reproductive: Status post hysterectomy. No adnexal masses. Other: A small amount of free fluid is present. There is fluid  about the liver and in the right pericolic gutter. Musculoskeletal:  Vertebral body heights are preserved. There is some straightening of the normal lumbar lordosis. Focal rightward curvature of the lumbar spine is centered at L2. Endplate changes are noted. Multilevel facet degenerative changes are present. Bony pelvis is within normal limits. Hips are located and unremarkable. IMPRESSION: 1. Recurrent/residual distal small bowel obstruction in the left lower quadrant. No definite obstructing mass lesion is present. 2. Abdominal ascites with fluid around the liver and into the anatomic pelvis is likely reactive. 3. Extensive colonic diverticulosis without focal inflammatory change to suggest diverticulitis. 4.  Aortic Atherosclerosis (ICD10-I70.0). 5. Punctate nonobstructing stone at the lower pole of the left kidney. 6. Degenerative changes in the lumbar spine. Electronically Signed   By: Marin Roberts M.D.   On: 04/21/2018 18:32   US Renal  Result Date: 04/13/2018 CLINICAL DATA:  Renal failure EXAM: RENAL / URINARY TRACT ULTRASOUND COMPLETE COMPARISON:  None. FINDINGS: Right Kidney: Renal measurements: 9.5 x 5.2 x 5.1 cm = volume: 132 mL . Echogenicity within normal limits. No mass or hydronephrosis visualized. Left Kidney: Renal measurements: 9.4 x 4.6 x 4.0 cm = volume: 91 mL. Echogenicity within normal limits. No mass or hydronephrosis visualized. Small echogenic foci within the left kidney, the largest 5 mm in the lower pole, likely nonobstructing stones. Bladder: Decompressed, grossly unremarkable. IMPRESSION: No acute findings.  No hydronephrosis. Suspect small nonobstructing left renal stones. Electronically Signed   By: Charlett Nose M.D.   On: 04/13/2018 19:06   Dg Abd 2 Views  Result Date: 04/23/2018 CLINICAL DATA:  Follow-up ileus EXAM: ABDOMEN - 2 VIEW COMPARISON:  March 7 1,020 FINDINGS: The NG tube terminates in stable position, likely in the gastric antrum. No large or small bowel  dilatation. Contrast is now seen throughout the colon. No other acute abnormalities. IMPRESSION: No evidence of ileus on today's study.  Stable NG tube placement. Electronically Signed   By: Gerome Sam III M.D   On: 04/23/2018 15:45   Dg Abd Portable 1v-small Bowel Obstruction Protocol-initial, 8 Hr Delay  Result Date: 04/22/2018 CLINICAL DATA:  Small-bowel obstruction. EXAM: PORTABLE ABDOMEN - 1 VIEW COMPARISON:  04/21/2018. FINDINGS: Nasogastric to tip in the region of the gastric pylorus. Injected contrast in the stomach with dilute contrast in mildly dilated small bowel loops. These do not appear as dilated as on the CT yesterday. Dense retained barium in a large number of colon diverticula and in the rectosigmoid colon. There is also some excreted contrast in the urinary bladder. Dextroconvex lumbar scoliosis and degenerative changes. IMPRESSION: Improving partial small bowel obstruction. Electronically Signed   By: Beckie Salts M.D.   On: 04/22/2018 10:07   Dg Abd Portable 1v-small Bowel Protocol-position Verification  Result Date: 04/22/2018 CLINICAL DATA:  NG tube placement. EXAM: PORTABLE ABDOMEN - 1 VIEW COMPARISON:  CT earlier this day FINDINGS: Tip and side port of the enteric tube below the diaphragm in the stomach. Dilated small bowel in CT is fluid-filled and not well seen by CT. Excreted IV contrast in the renal collecting systems from CT. Retained contrast within multiple diverticula in the colon. IMPRESSION: Tip and side port of the enteric tube below the diaphragm in the stomach. Electronically Signed   By: Narda Rutherford M.D.   On: 04/22/2018 00:12   Dg Abd Portable 1v  Result Date: 04/16/2018 CLINICAL DATA:  Small-bowel obstruction. EXAM: PORTABLE ABDOMEN - 1 VIEW COMPARISON:  04/15/2018; abdominal CT-04/13/2018 FINDINGS: Persistent though improved mild distension of several patulous loops of small bowel with index loop of  small bowel in the left mid hemiabdomen measuring 2.5 cm  in diameter, previously, 3.7 cm. This finding is associated with potential bowel wall thickening. Enteric contrast remains within the colon, in a similar location compared to the 04/15/2018 examination. Enteric tube tip and side port again project over the expected location of the gastric fundus. Nondiagnostic evaluation for pneumoperitoneum secondary to supine positioning and exclusion of the lower thorax. No pneumatosis or portal venous gas. Degenerative change of the lower lumbar spine, incompletely evaluated. IMPRESSION: Findings most suggestive of slightly improved though persistent small-bowel obstruction with slight reduction of gaseous distention of the small bowel though with persistent suspected small bowel wall thickening and unchanged location of ingested enteric contrast within the colon. Electronically Signed   By: Simonne Come M.D.   On: 04/16/2018 06:29   Dg Abd Portable 1v  Result Date: 04/15/2018 CLINICAL DATA:  8 hour port abdomen EXAM: PORTABLE ABDOMEN - 1 VIEW COMPARISON:  04/15/2018 FINDINGS: Nasogastric tube is in place. There is residual contrast in the stomach and large bowel loops. There is persistent dilatation of small bowel loops, partially obscured by overlying contrast. The dilatation of small bowel appears slightly improved compared with most recent exam. Contrast is identified within the rectum, excluding complete obstruction. IMPRESSION: Suspect slight improvement in the partial small bowel obstruction. There is persistent dilatation of small bowel loops partially obscured by the contrast. Electronically Signed   By: Norva Pavlov M.D.   On: 04/15/2018 17:59   Dg Abd Portable 1v-small Bowel Obstruction Protocol-initial, 8 Hr Delay  Result Date: 04/15/2018 CLINICAL DATA:  Small bowel obstruction. 8 hour delay. EXAM: PORTABLE ABDOMEN - 1 VIEW COMPARISON:  04/14/2018 FINDINGS: Contrast material is demonstrated in the stomach, small bowel, and right colon. Presence of contrast  material in the colon suggest partial small bowel obstruction. Small bowel remain dilated with evidence of wall thickening. Enteric tube in the left upper quadrant consistent with location in the upper stomach. Degenerative changes and scoliosis of the lumbar spine. IMPRESSION: Dilated small bowel with contrast material in the colon suggesting partial small bowel obstruction. Electronically Signed   By: Burman Nieves M.D.   On: 04/15/2018 02:34   Dg Vangie Bicker G Tube Plc W/fl W/rad  Result Date: 04/14/2018 CLINICAL DATA:  Unable to place a nasogastric tube on the floor due to proximal obstruction. The tube is being placed for high-grade small bowel obstruction seen on a CT yesterday. EXAM: NASO G TUBE PLACEMENT WITH FL AND WITH RAD CONTRAST:  None used. FLUOROSCOPY TIME:  Fluoroscopy Time:  0 minutes 24 seconds Radiation Exposure Index (if provided by the fluoroscopic device): 3.0 mGy Number of Acquired Spot Images: 0 COMPARISON:  Abdomen and pelvis CT dated 04/13/2018 FINDINGS: Under fluoroscopic guidance, a nasogastric tube was passed through the patient's left nostril and into the stomach. The tip and side hole were left in the proximal stomach. The patient tolerated the procedure with no complications. The post placement image demonstrates lumbar spine degenerative changes and scoliosis. No dilated bowel loops are visible on that image with a paucity of loops containing gas. IMPRESSION: Successful fluoroscopic guided nasogastric tube placement, as described above. Electronically Signed   By: Beckie Salts M.D.   On: 04/14/2018 15:52    Time Spent in minutes  25   Eashan Schipani M.D on 04/25/2018 at 4:04 PM  Between 7am to 7pm - Pager - [phone]  After 7pm go to [email or web address] - password [facility]  [facility] -  Office  [phone]

## 2018-04-26 LAB — BASIC METABOLIC PANEL
Anion gap: 5 (ref 5–15)
BUN: <5 mg/dL — ABNORMAL LOW (ref 8–23)
CALCIUM: 9.2 mg/dL (ref 8.9–10.3)
CO2: 23 mmol/L (ref 22–32)
Chloride: 106 mmol/L (ref 98–111)
Creatinine, Ser: 0.86 mg/dL (ref 0.44–1.00)
GFR calc Af Amer: >60 mL/min (ref >60)
GFR calc non Af Amer: >60 mL/min (ref >60)
Glucose, Bld: 90 mg/dL (ref 70–99)
Potassium: 4.4 mmol/L (ref 3.5–5.1)
Sodium: 134 mmol/L — ABNORMAL LOW (ref 135–145)

## 2018-04-26 LAB — MAGNESIUM: Magnesium: 1.9 mg/dL (ref 1.7–2.4)

## 2018-04-26 MED ORDER — VITAMIN D 25 MCG (1000 UNIT) PO TABS
1000.0000 [IU] | ORAL_TABLET | Freq: Every day | ORAL | Status: DC
  Administered 2018-04-26: 1000 [IU] via ORAL
  Filled 2018-04-26: qty 1

## 2018-04-26 NOTE — Progress Notes (Signed)
Discharge instructions given to pt and all questions were answered.  

## 2018-04-26 NOTE — Discharge Instructions (Signed)
Bowel Obstruction °A bowel obstruction means that something is blocking the small or large bowel. The bowel is also called the intestine. It is the long tube that connects the stomach to the opening of the butt (anus). When something blocks the bowel, food and fluids cannot pass through like normal. This condition needs to be treated. Treatment depends on the cause of the problem and how bad the problem is. °What are the causes? °Common causes of this condition include: °· Scar tissue (adhesions) from past surgery or from high-energy X-rays (radiation). °· Recent surgery in the belly. This affects how food moves in the bowel. °· Some diseases, such as: °? Irritation of the lining of the digestive tract (Crohn's disease). °? Irritation of small pouches in the bowel (diverticulitis). °· Growths or tumors. °· A bulging organ (hernia). °· Twisting of the bowel (volvulus). °· A foreign body. °· Slipping of a part of the bowel into another part (intussusception). °What are the signs or symptoms? °Symptoms of this condition include: °· Pain in the belly. °· Feeling sick to your stomach (nauseous). °· Throwing up (vomiting). °· Bloating in the belly. °· Being unable to pass gas. °· Trouble pooping (constipation). °· Watery poop (diarrhea). °· A lot of belching. °How is this diagnosed? °This condition may be diagnosed based on: °· A physical exam. °· Medical history. °· Imaging tests, such as X-ray or CT scan. °· Blood tests. °· Urine tests. °How is this treated? °Treatment for this condition may include: °· Fluids and pain medicines that are given through an IV tube. Your doctor may tell you not to eat or drink if you feel sick to your stomach and are throwing up. °· Eating a clear liquid diet for a few days. °· Putting a small tube (nasogastric tube) into the stomach. This will help with pain, discomfort, and nausea by removing blocked air and fluids from the stomach. °· Surgery. This may be needed if other treatments do  not work. °Follow these instructions at home: °Medicines °· Take over-the-counter and prescription medicines only as told by your doctor. °· If you were prescribed an antibiotic medicine, take it as told by your doctor. Do not stop taking the antibiotic even if you start to feel better. °General instructions °· Follow your diet as told by your doctor. You may need to: °? Only drink clear liquids until you start to get better. °? Avoid solid foods. °· Return to your normal activities as told by your doctor. Ask your doctor what activities are safe for you. °· Do not sit for a long time without moving. Get up to take short walks every 1-2 hours. This is important. Ask for help if you feel weak or unsteady. °· Keep all follow-up visits as told by your doctor. This is important. °How is this prevented? °After having a bowel obstruction, you may be more likely to have another. You can do some things to stop it from happening again. °· If you have a long-term (chronic) disease, contact your doctor if you see changes or problems. °· Take steps to prevent or treat trouble pooping. Your doctor may ask that you: °? Drink enough fluid to keep your pee (urine) pale yellow. °? Take over-the-counter or prescription medicines. °? Eat foods that are high in fiber. These include beans, whole grains, and fresh fruits and vegetables. °? Limit foods that are high in fat and sugar. These include fried or sweet foods. °· Stay active. Ask your doctor which exercises are   safe for you.  Avoid stress.  Eat three small meals and three small snacks each day.  Work with a Publishing rights manager (dietitian) to make a meal plan that works for you.  Do not use any products that contain nicotine or tobacco, such as cigarettes and e-cigarettes. If you need help quitting, ask your doctor. Contact a doctor if:  You have a fever.  You have chills. Get help right away if:  You have pain or cramps that get worse.  You throw up blood.  You are  sick to your stomach.  You cannot stop throwing up.  You cannot drink fluids.  You feel mixed up (confused).  You feel very thirsty (dehydrated).  Your belly gets more bloated.  You feel weak or you pass out (faint). Summary  A bowel obstruction means that something is blocking the small or large bowel.  Treatment may include IV fluids and pain medicine. You may also have a clear liquid diet, a small tube in your stomach, or surgery.  Drink clear liquids and avoid solid foods until you get better. This information is not intended to replace advice given to you by your health care provider. Make sure you discuss any questions you have with your health care provider. Document Released: 03/11/2004 Document Revised: 06/15/2017 Document Reviewed: 06/15/2017 Elsevier Interactive Patient Education  2019 [place] A soft-food eating plan includes foods that are safe and easy to chew and swallow. Your health care provider or dietitian can help you find foods and flavors that fit into this plan. Follow this plan until your health care provider or dietitian says it is safe to start eating other foods and food textures. What are tips for following this plan? General guidelines   Take small bites of food, or cut food into pieces about  inch or smaller. Bite-sized pieces of food are easier to chew and swallow.  Eat moist foods. Avoid overly dry foods.  Avoid foods that: ? Are difficult to swallow, such as dry, chunky, crispy, or sticky foods. ? Are difficult to chew, such as hard, tough, or stringy foods. ? Contain nuts, seeds, or fruits.  Follow instructions from your dietitian about the types of liquids that are safe for you to swallow. You may be allowed to have: ? Thick liquids only. This includes only liquids that are thicker than honey. ? Thin and thick liquids. This includes all beverages and foods that become liquid at room temperature.  To make  thick liquids: ? Purchase a commercial liquid thickening powder. These are available at grocery stores and pharmacies. ? Mix the thickener into liquids according to instructions on the label. ? Purchase ready-made thickened liquids. ? Thicken soup by pureeing, straining to remove chunks, and adding flour, potato flakes, or corn starch. ? Add commercial thickener to foods that become liquid at room temperature, such as milk shakes, yogurt, ice cream, gelatin, and sherbet.  Ask your health care provider whether you need to take a fiber supplement. Cooking  Cook meats so they stay tender and moist. Use methods like braising, stewing, or baking in liquid.  Cook vegetables and fruit until they are soft enough to be mashed with a fork.  Peel soft, fresh fruits such as peaches, nectarines, and melons.  When making soup, make sure chunks of meat and vegetables are smaller than  inch.  Reheat leftover foods slowly so that a tough crust does not form. What foods are allowed? The items listed below  may not be a complete list. Talk with your dietitian about what dietary choices are best for you. Grains Breads, muffins, pancakes, or waffles moistened with syrup, jelly, or butter. Dry cereals well-moistened with milk. Moist, cooked cereals. Well-cooked pasta and rice. Vegetables All soft-cooked vegetables. Shredded lettuce. Fruits All canned and cooked fruits. Soft, peeled fresh fruits. Strawberries. Dairy Milk. Cream. Yogurt. Cottage cheese. Soft cheese without the rind. Meats and other protein foods Tender, moist ground meat, poultry, or fish. Meat cooked in gravy or sauces. Eggs. Sweets and desserts Ice cream. Milk shakes. Sherbet. Pudding. Fats and oils Butter. Margarine. Olive, canola, sunflower, and grapeseed oil. Smooth salad dressing. Smooth cream cheese. Mayonnaise. Gravy. What foods are not allowed? The items listed bemay not be a complete list. Talk with your dietitian about what  dietary choices are best for you. Grains Coarse or dry cereals, such as bran, granola, and shredded wheat. Tough or chewy crusty breads, such as [place] bread or baguettes. Breads with nuts, seeds, or fruit. Vegetables All raw vegetables. Cooked corn. Cooked vegetables that are tough or stringy. Tough, crisp, fried potatoes and potato skins. Fruits Fresh fruits with skins or seeds, or both, such as apples, pears, and grapes. Stringy, high-pulp fruits, such as papaya, pineapple, coconut, and mango. Fruit leather and all dried fruit. Dairy Yogurt with nuts or coconut. Meats and other protein foods Hard, dry sausages. Dry meat, poultry, or fish. Meats with gristle. Fish with bones. Fried meat or fish. Lunch meat and hotdogs. Nuts and seeds. Chunky peanut butter or other nut butters. Sweets and desserts Cakes or cookies that are very dry or chewy. Desserts with dried fruit, nuts, or coconut. Fried pastries. Very rich pastries. Fats and oils Cream cheese with fruit or nuts. Salad dressings with seeds or chunks. Summary  A soft-food eating plan includes foods that are safe and easy to swallow. Generally, the foods should be soft enough to be mashed with a fork.  Avoid foods that are dry, hard to chew, crunchy, sticky, stringy, or crispy.  Ask your health care provider whether you need to thicken your liquids and if you need to take a fiber supplement. This information is not intended to replace advice given to you by your health care provider. Make sure you discuss any questions you have with your health care provider. Document Released: 05/11/2007 Document Revised: 04/06/2016 Document Reviewed: 04/06/2016 Elsevier Interactive Patient Education  2019 [place] Irregular bowel habits such as constipation and diarrhea can lead to many problems over time.  Having one soft bowel movement a day is the most important way to prevent further problems.  The anorectal  canal is designed to handle stretching and feces to safely manage our ability to get rid of solid waste (feces, poop, stool) out of our body.  BUT, hard constipated stools can act like ripping concrete bricks and diarrhea can be a burning fire to this very sensitive area of our body, causing inflamed hemorrhoids, anal fissures, increasing risk is perirectal abscesses, abdominal pain/bloating, an making irritable bowel worse.     The goal: ONE SOFT BOWEL MOVEMENT A DAY!  To have soft, regular bowel movements:   Drink at least 8 tall glasses of water a day.     you can start this in about a week   Take plenty of fiber.  Fiber is the undigested part of plant food that passes into the colon, acting s natures broom to encourage bowel motility and movement.  Fiber can absorb and hold large amounts of water. This results in a larger, bulkier stool, which is soft and easier to pass. Work gradually over several weeks up to 6 servings a day of fiber (25g a day even more if needed) in the form of: o Vegetables -- Root (potatoes, carrots, turnips), leafy green (lettuce, salad greens, celery, spinach), or cooked high residue (cabbage, broccoli, etc) o Fruit -- Fresh (unpeeled skin & pulp), Dried (prunes, apricots, cherries, etc ),  or stewed ( applesauce)  o Whole grain breads, pasta, etc (whole wheat)  o Bran cereals   Bulking Agents -- This type of water-retaining fiber generally is easily obtained each day by one of the following:  o Psyllium bran -- The psyllium plant is remarkable because its ground seeds can retain so much water. This product is available as Metamucil, Konsyl, Effersyllium, Per Diem Fiber, or the less expensive generic preparation in drug and health food stores. Although labeled a laxative, it really is not a laxative.  o Methylcellulose -- This is another fiber derived from wood which also retains water. It is available as Citrucel. o Polyethylene Glycol - and artificial fiber commonly  called Miralax or Glycolax.  It is helpful for people with gassy or bloated feelings with regular fiber o Flax Seed - a less gassy fiber than psyllium  No reading or other relaxing activity while on the toilet. If bowel movements take longer than 5 minutes, you are too constipated  AVOID CONSTIPATION.  High fiber and water intake usually takes care of this.  Sometimes a laxative is needed to stimulate more frequent bowel movements, but   Laxatives are not a good long-term solution as it can wear the colon out. o Osmotics (Milk of Magnesia, Fleets phosphosoda, Magnesium citrate, MiraLax, GoLytely) are safer than  o Stimulants (Senokot, Castor Oil, Dulcolax, Ex Lax)    o Do not take laxatives for more than 7days in a row.   IF SEVERELY CONSTIPATED, try a Bowel Retraining Program: o Do not use laxatives.  o Eat a diet high in roughage, such as bran cereals and leafy vegetables.  o Drink six (6) ounces of prune or apricot juice each morning.  o Eat two (2) large servings of stewed fruit each day.  o Take one (1) heaping tablespoon of a psyllium-based bulking agent twice a day. Use sugar-free sweetener when possible to avoid excessive calories.  o Eat a normal breakfast.  o Set aside 15 minutes after breakfast to sit on the toilet, but do not strain to have a bowel movement.  o If you do not have a bowel movement by the third day, use an enema and repeat the above steps.   Controlling diarrhea o Switch to liquids and simpler foods for a few days to avoid stressing your intestines further. o Avoid dairy products (especially milk & ice cream) for a short time.  The intestines often can lose the ability to digest lactose when stressed. o Avoid foods that cause gassiness or bloating.  Typical foods include beans and other legumes, cabbage, broccoli, and dairy foods.  Every person has some sensitivity to other foods, so listen to our body and avoid those foods that trigger problems for you. o Adding  fiber (Citrucel, Metamucil, psyllium, Miralax) gradually can help thicken stools by absorbing excess fluid and retrain the intestines to act more normally.  Slowly increase the dose over a few weeks.  Too much fiber too soon can backfire and cause cramping &  bloating. o Probiotics (such as active yogurt, Align, etc) may help repopulate the intestines and colon with normal bacteria and calm down a sensitive digestive tract.  Most studies show it to be of mild help, though, and such products can be costly. o Medicines: - Bismuth subsalicylate (ex. Kayopectate, Pepto Bismol) every 30 minutes for up to 6 doses can help control diarrhea.  Avoid if pregnant. - Loperamide (Immodium) can slow down diarrhea.  Start with two tablets (4mg  total) first and then try one tablet every 6 hours.  Avoid if you are having fevers or severe pain.  If you are not better or start feeling worse, stop all medicines and call your doctor for advice o Call your doctor if you are getting worse or not better.  Sometimes further testing (cultures, endoscopy, X-ray studies, bloodwork, etc) may be needed to help diagnose and treat the cause of the diarrhea.

## 2018-04-26 NOTE — Discharge Summary (Addendum)
Physician Discharge Summary  Chelsea Petersen DDU:[id number] DOB: 06/20/1940 DOA: 04/21/2018  PCP: [name] I, NP  Admit date: 04/21/2018 Discharge date: 04/26/2018  Admitted From: Home Disposition: Home  Recommendations for Outpatient Follow-up:  1. Follow up with PCP in 1 week 2. Please obtain BMP in one week 3. Recommend continue to follow BPs as slightly elevated during hospitalization, may need to maintain outpatient BP log and further titration of her lisinopril.  Home Health: No Equipment/Devices: None  Discharge Condition: Stable CODE STATUS: Full code Diet recommendation: Soft/heart healthy diet  History of present illness:  Chelsea Petersen is a 78 year old Caucasian female with past medical history remarkable for hypertension, remote history of breast cancer and uterine cancer status post hysterectomy and history of diverticulitis who was hospitalized recently for small bowel obstruction, thought to be mechanical secondary to adhesions, managed conservatively and discharged home return back 4 days later with increasing abdominal distention with nausea and vomiting.  Found to have recurrent small bowel obstruction.  Hospital course:  Partial Small bowel obstruction Patient presenting with recurrent abdominal pain in the setting of recent small bowel obstruction.  History of hysterectomy in the past likely associated with adhesions.  CT abdomen pelvis notable for persistent small bowel obstruction left lower quadrant.  General surgery was consulted and followed during the hospital course.  She was treated conservatively with NG tube for bowel decompression pain management, antiemetics and IV fluid hydration.  Patient was able to transition to a clear liquid diet which she tolerated and further advanced to soft.  Her NG tube was subsequently discontinued.  Patient continued to regain bowel movements and flatus without any recurrent nausea or vomiting.  Patient stable for discharge  home.  Discussed with her maintaining hydration and continue soft diet over the next couple days before transitioning to a more regular diet.  Follow-up with PCP in 1 week.  Essential hypertension Blood pressure was slightly elevated during hospitalization.  Continued on home 10 mg p.o. daily.  Recommend follow-up with PCP for further evaluation and adjustments of antihypertensives if necessary.  History of breast cancer Currently in remission, continue outpatient follow-up.    Discharge Diagnoses:  Active Problems:   Essential hypertension    Discharge Instructions  Discharge Instructions    Call MD for:  difficulty breathing, headache or visual disturbances   Complete by:  As directed    Call MD for:  persistant dizziness or light-headedness   Complete by:  As directed    Call MD for:  persistant nausea and vomiting   Complete by:  As directed    Call MD for:  severe uncontrolled pain   Complete by:  As directed    Call MD for:  temperature >100.4   Complete by:  As directed    Diet - low sodium heart healthy   Complete by:  As directed    Increase activity slowly   Complete by:  As directed      Allergies as of 04/26/2018      Reactions   Other Swelling, Other (See Comments)   "eye drops" (PVA)      Medication List    TAKE these medications   ACIDOPHILUS LACTOBACILLUS PO Take 1 capsule by mouth daily.   celecoxib 200 MG capsule Commonly known as:  CELEBREX Take 200 mg by mouth daily with breakfast.   Dymista 137-50 MCG/ACT Susp Generic drug:  Azelastine-Fluticasone Place 1 spray into both nostrils every evening.   lisinopril 10 MG tablet Commonly known as:  PRINIVIL,ZESTRIL Take 10 mg by mouth daily.   multivitamin capsule Take 1 capsule by mouth daily.   Vitamin D3 25 MCG (1000 UT) Caps Take 1,000 Units by mouth daily.      Follow-up Information    [name] I, NP. Schedule an appointment as soon as possible for a visit in 1 week(s).    Specialty:  Nurse Practitioner Contact information: [address] [place] [address] [phone]          Allergies  Allergen Reactions  . Other Swelling and Other (See Comments)    "eye drops" (PVA)    Consultations:  General surgery, Dr. Creig Hines.   Procedures/Studies: Ct Abdomen Pelvis Wo Contrast  Result Date: 04/13/2018 CLINICAL DATA:  Lower abdominal pain with nausea and vomiting for 2 days. EXAM: CT ABDOMEN AND PELVIS WITHOUT CONTRAST TECHNIQUE: Multidetector CT imaging of the abdomen and pelvis was performed following the standard protocol without IV contrast. COMPARISON:  Ultrasound kidneys 04/13/2018 FINDINGS: Lower chest: Atelectasis in the lung bases. 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 Coronary artery calcifications. Mild cardiac enlargement. Hepatobiliary: No focal liver lesions. Gallbladder is contracted but otherwise normal. No bile duct dilatation. Pancreas: Unremarkable. No pancreatic ductal dilatation or surrounding inflammatory changes. Spleen: Normal in size without focal abnormality. Adrenals/Urinary Tract: No adrenal gland nodules. Stone in the lower pole left kidney measuring 3 mm diameter. No hydronephrosis or hydroureter. Bladder is unremarkable. Stomach/Bowel: Market distention of fluid-filled stomach and small bowel. Distal small bowel are decompressed. Transition zone appears to be in the left lower quadrant. No cause is identified, suggesting probable adhesions. Appearance suggest high-grade obstruction. Scattered stool throughout the colon without colonic distention. Colonic diverticulosis without evidence of diverticulitis. Appendix is normal. Vascular/Lymphatic: Extensive aortic and vascular calcifications. Reproductive: Status post hysterectomy. No adnexal masses. Other: Small amount of free fluid in the upper abdomen, likely ascites or reactive fluid. No free air. Abdominal wall musculature appears intact. Musculoskeletal:  Degenerative changes in the lumbar spine. Mild scoliosis convex towards the right, likely degenerative. IMPRESSION: 1. High-grade small bowel obstruction with transition zone in the left lower quadrant. No cause is identified, suggesting adhesions. 2. Small amount of free fluid in the abdomen, likely ascites or reactive fluid. 3. Nonobstructing stone in the lower pole left kidney. 4. Colonic diverticulosis without evidence of diverticulitis. Aortic Atherosclerosis (ICD10-I70.0). Electronically Signed   By: Lucienne Capers M.D.   On: 04/13/2018 22:49   Ct Abdomen Pelvis W Contrast  Result Date: 04/21/2018 CLINICAL DATA:  Small bowel obstruction. Personal history of uterine cancer and left breast cancer. EXAM: CT ABDOMEN AND PELVIS WITH CONTRAST TECHNIQUE: Multidetector CT imaging of the abdomen and pelvis was performed using the standard protocol following bolus administration of intravenous contrast. CONTRAST:  <See Chart> ISOVUE-300 IOPAMIDOL (ISOVUE-300) INJECTION 61% COMPARISON:  CT of the abdomen and pelvis 04/13/2018. One-view abdomen 04/16/2018. FINDINGS: Lower chest: Focal atelectasis is again noted base. At the left the heart is in the left side of the chest. The patient is status post left mastectomy. Right lung bases clear. Hepatobiliary: No focal liver abnormality is seen. No gallstones, gallbladder wall thickening, or biliary dilatation. Pancreas: Unremarkable. No pancreatic ductal dilatation or surrounding inflammatory changes. Spleen: Normal in size without focal abnormality. Adrenals/Urinary Tract: Adrenal glands are normal bilaterally. A punctate nonobstructing stone is present at the lower pole of the left kidney. Kidneys and ureters are otherwise normal. The urinary bladder is within normal limits. Stomach/Bowel: Stomach is distended. Proximal bowel loops are distended  up to 4 cm. Focal obstruction is again noted in the left lower quadrant. The more distal bowel is collapsed. Diverticular  changes are present throughout the colon. There is some contrast throughout the colon. The colon is mostly collapsed. Vascular/Lymphatic: Atherosclerotic calcifications are present in the aorta and branch vessels. There is no aneurysm. Reproductive: Status post hysterectomy. No adnexal masses. Other: A small amount of free fluid is present. There is fluid about the liver and in the right pericolic gutter. Musculoskeletal: Vertebral body heights are preserved. There is some straightening of the normal lumbar lordosis. Focal rightward curvature of the lumbar spine is centered at L2. Endplate changes are noted. Multilevel facet degenerative changes are present. Bony pelvis is within normal limits. Hips are located and unremarkable. IMPRESSION: 1. Recurrent/residual distal small bowel obstruction in the left lower quadrant. No definite obstructing mass lesion is present. 2. Abdominal ascites with fluid around the liver and into the anatomic pelvis is likely reactive. 3. Extensive colonic diverticulosis without focal inflammatory change to suggest diverticulitis. 4.  Aortic Atherosclerosis (ICD10-I70.0). 5. Punctate nonobstructing stone at the lower pole of the left kidney. 6. Degenerative changes in the lumbar spine. Electronically Signed   By: San Morelle M.D.   On: 04/21/2018 18:32   US Renal  Result Date: 04/13/2018 CLINICAL DATA:  Renal failure EXAM: RENAL / URINARY TRACT ULTRASOUND COMPLETE COMPARISON:  None. FINDINGS: Right Kidney: Renal measurements: 9.5 x 5.2 x 5.1 cm = volume: 132 mL . Echogenicity within normal limits. No mass or hydronephrosis visualized. Left Kidney: Renal measurements: 9.4 x 4.6 x 4.0 cm = volume: 91 mL. Echogenicity within normal limits. No mass or hydronephrosis visualized. Small echogenic foci within the left kidney, the largest 5 mm in the lower pole, likely nonobstructing stones. Bladder: Decompressed, grossly unremarkable. IMPRESSION: No acute findings.  No  hydronephrosis. Suspect small nonobstructing left renal stones. Electronically Signed   By: Rolm Baptise M.D.   On: 04/13/2018 19:06   Dg Abd 2 Views  Result Date: 04/23/2018 CLINICAL DATA:  Follow-up ileus EXAM: ABDOMEN - 2 VIEW COMPARISON:  March 7 1,020 FINDINGS: The NG tube terminates in stable position, likely in the gastric antrum. No large or small bowel dilatation. Contrast is now seen throughout the colon. No other acute abnormalities. IMPRESSION: No evidence of ileus on today's study.  Stable NG tube placement. Electronically Signed   By: Dorise Bullion III M.D   On: 04/23/2018 15:45   Dg Abd Portable 1v-small Bowel Obstruction Protocol-initial, 8 Hr Delay  Result Date: 04/22/2018 CLINICAL DATA:  Small-bowel obstruction. EXAM: PORTABLE ABDOMEN - 1 VIEW COMPARISON:  04/21/2018. FINDINGS: Nasogastric to tip in the region of the gastric pylorus. Injected contrast in the stomach with dilute contrast in mildly dilated small bowel loops. These do not appear as dilated as on the CT yesterday. Dense retained barium in a large number of colon diverticula and in the rectosigmoid colon. There is also some excreted contrast in the urinary bladder. Dextroconvex lumbar scoliosis and degenerative changes. IMPRESSION: Improving partial small bowel obstruction. Electronically Signed   By: Claudie Revering M.D.   On: 04/22/2018 10:07   Dg Abd Portable 1v-small Bowel Protocol-position Verification  Result Date: 04/22/2018 CLINICAL DATA:  NG tube placement. EXAM: PORTABLE ABDOMEN - 1 VIEW COMPARISON:  CT earlier this day FINDINGS: Tip and side port of the enteric tube below the diaphragm in the stomach. Dilated small bowel in CT is fluid-filled and not well seen by CT. Excreted IV contrast in the renal  collecting systems from CT. Retained contrast within multiple diverticula in the colon. IMPRESSION: Tip and side port of the enteric tube below the diaphragm in the stomach. Electronically Signed   By: Keith Rake  M.D.   On: 04/22/2018 00:12   Dg Abd Portable 1v  Result Date: 04/16/2018 CLINICAL DATA:  Small-bowel obstruction. EXAM: PORTABLE ABDOMEN - 1 VIEW COMPARISON:  04/15/2018; abdominal CT-04/13/2018 FINDINGS: Persistent though improved mild distension of several patulous loops of small bowel with index loop of small bowel in the left mid hemiabdomen measuring 2.5 cm in diameter, previously, 3.7 cm. This finding is associated with potential bowel wall thickening. Enteric contrast remains within the colon, in a similar location compared to the 04/15/2018 examination. Enteric tube tip and side port again project over the expected location of the gastric fundus. Nondiagnostic evaluation for pneumoperitoneum secondary to supine positioning and exclusion of the lower thorax. No pneumatosis or portal venous gas. Degenerative change of the lower lumbar spine, incompletely evaluated. IMPRESSION: Findings most suggestive of slightly improved though persistent small-bowel obstruction with slight reduction of gaseous distention of the small bowel though with persistent suspected small bowel wall thickening and unchanged location of ingested enteric contrast within the colon. Electronically Signed   By: Sandi Mariscal M.D.   On: 04/16/2018 06:29   Dg Abd Portable 1v  Result Date: 04/15/2018 CLINICAL DATA:  8 hour port abdomen EXAM: PORTABLE ABDOMEN - 1 VIEW COMPARISON:  04/15/2018 FINDINGS: Nasogastric tube is in place. There is residual contrast in the stomach and large bowel loops. There is persistent dilatation of small bowel loops, partially obscured by overlying contrast. The dilatation of small bowel appears slightly improved compared with most recent exam. Contrast is identified within the rectum, excluding complete obstruction. IMPRESSION: Suspect slight improvement in the partial small bowel obstruction. There is persistent dilatation of small bowel loops partially obscured by the contrast. Electronically Signed   By:  Nolon Nations M.D.   On: 04/15/2018 17:59   Dg Abd Portable 1v-small Bowel Obstruction Protocol-initial, 8 Hr Delay  Result Date: 04/15/2018 CLINICAL DATA:  Small bowel obstruction. 8 hour delay. EXAM: PORTABLE ABDOMEN - 1 VIEW COMPARISON:  04/14/2018 FINDINGS: Contrast material is demonstrated in the stomach, small bowel, and right colon. Presence of contrast material in the colon suggest partial small bowel obstruction. Small bowel remain dilated with evidence of wall thickening. Enteric tube in the left upper quadrant consistent with location in the upper stomach. Degenerative changes and scoliosis of the lumbar spine. IMPRESSION: Dilated small bowel with contrast material in the colon suggesting partial small bowel obstruction. Electronically Signed   By: Lucienne Capers M.D.   On: 04/15/2018 02:34   Dg Addison Bailey G Tube Plc W/fl W/rad  Result Date: 04/14/2018 CLINICAL DATA:  Unable to place a nasogastric tube on the floor due to proximal obstruction. The tube is being placed for high-grade small bowel obstruction seen on a CT yesterday. EXAM: NASO G TUBE PLACEMENT WITH FL AND WITH RAD CONTRAST:  None used. FLUOROSCOPY TIME:  Fluoroscopy Time:  0 minutes 24 seconds Radiation Exposure Index (if provided by the fluoroscopic device): 3.0 mGy Number of Acquired Spot Images: 0 COMPARISON:  Abdomen and pelvis CT dated 04/13/2018 FINDINGS: Under fluoroscopic guidance, a nasogastric tube was passed through the patient's left nostril and into the stomach. The tip and side hole were left in the proximal stomach. The patient tolerated the procedure with no complications. The post placement image demonstrates lumbar spine degenerative changes and scoliosis. No dilated  bowel loops are visible on that image with a paucity of loops containing gas. IMPRESSION: Successful fluoroscopic guided nasogastric tube placement, as described above. Electronically Signed   By: Claudie Revering M.D.   On: 04/14/2018 15:52       Subjective: Patient seen and examined at bedside, both walking around the hallways this morning.  Reports bowel movements and continues to pass flatus.  Tolerating diet now transition to soft.  No other complaints at this time.  General surgery okay for discharge as long as continues to tolerate diet.  Patient without any further complaints.  Denies headache, no fever/chills/night sweats, no chest pain, no palpitations, no abdominal pain, no issues with bowel/bladder function.  No acute events overnight per nursing staff.   Discharge Exam: Vitals:   04/25/18 2039 04/26/18 0515  BP: (!) 156/70 (!) 171/75  Pulse: 75 70  Resp: 16 15  Temp: 98.1 F (36.7 C) 98.8 F (37.1 C)  SpO2: 98% 99%   Vitals:   04/25/18 0509 04/25/18 1403 04/25/18 2039 04/26/18 0515  BP: (!) 164/72 (!) 155/75 (!) 156/70 (!) 171/75  Pulse: 83 78 75 70  Resp: 16 16 16 15   Temp: 98.8 F (37.1 C) 98.2 F (36.8 C) 98.1 F (36.7 C) 98.8 F (37.1 C)  TempSrc: Oral Oral Oral Oral  SpO2: 99% 100% 98% 99%  Weight:      Height:        General: Pt is alert, awake, not in acute distress Cardiovascular: RRR, S1/S2 +, no rubs, no gallops Respiratory: CTA bilaterally, no wheezing, no rhonchi Abdominal: Soft, NT, ND, bowel sounds + Extremities: no edema, no cyanosis    The results of significant diagnostics from this hospitalization (including imaging, microbiology, ancillary and laboratory) are listed below for reference.     Microbiology: No results found for this or any previous visit (from the past 240 hour(s)).   Labs: BNP (last 3 results) No results for input(s): BNP in the last 8760 hours. Basic Metabolic Panel: Recent Labs  Lab 04/22/18 0422 04/23/18 0350 04/24/18 0355 04/25/18 0424 04/26/18 0518  NA 135 134* 135 136 134*  K 3.5 3.1* 4.0 3.4* 4.4  CL 104 107 107 106 106  CO2 22 23 23 24 23   GLUCOSE 131* 119* 113* 102* 90  BUN 13 9 <5* <5* <5*  CREATININE 0.99 0.84 0.78 0.75 0.86   CALCIUM 8.8* 8.2* 8.8* 8.8* 9.2  MG  --   --   --  1.7 1.9   Liver Function Tests: Recent Labs  Lab 04/21/18 1636 04/22/18 0422  AST 51* 38  ALT 70* 56*  ALKPHOS 67 59  BILITOT 1.0 0.7  PROT 6.7 6.0*  ALBUMIN 4.0 3.6   Recent Labs  Lab 04/21/18 1636  LIPASE 73*   No results for input(s): AMMONIA in the last 168 hours. CBC: Recent Labs  Lab 04/21/18 1800 04/22/18 0422 04/24/18 0355  WBC 9.5 7.6 7.7  NEUTROABS 7.2 5.5 5.3  HGB 12.2 12.0 10.8*  HCT 39.3 39.5 35.3*  MCV 98.7 100.0 99.4  PLT 333 326 303   Cardiac Enzymes: No results for input(s): CKTOTAL, CKMB, CKMBINDEX, TROPONINI in the last 168 hours. BNP: Invalid input(s): POCBNP CBG: Recent Labs  Lab 04/24/18 1141 04/24/18 1618 04/24/18 2200 04/25/18 0804 04/25/18 1204  GLUCAP 92 93 81 84 86   D-Dimer No results for input(s): DDIMER in the last 72 hours. Hgb A1c No results for input(s): HGBA1C in the last 72 hours. Lipid Profile No results  for input(s): CHOL, HDL, LDLCALC, TRIG, CHOLHDL, LDLDIRECT in the last 72 hours. Thyroid function studies No results for input(s): TSH, T4TOTAL, T3FREE, THYROIDAB in the last 72 hours.  Invalid input(s): FREET3 Anemia work up No results for input(s): VITAMINB12, FOLATE, FERRITIN, TIBC, IRON, RETICCTPCT in the last 72 hours. Urinalysis    Component Value Date/Time   COLORURINE YELLOW 04/13/2018 1631   APPEARANCEUR HAZY (A) 04/13/2018 1631   LABSPEC >1.030 (H) 04/13/2018 1631   PHURINE 5.0 04/13/2018 1631   GLUCOSEU NEGATIVE 04/13/2018 1631   HGBUR TRACE (A) 04/13/2018 1631   BILIRUBINUR SMALL (A) 04/13/2018 1631   KETONESUR NEGATIVE 04/13/2018 1631   PROTEINUR NEGATIVE 04/13/2018 1631   NITRITE NEGATIVE 04/13/2018 1631   LEUKOCYTESUR TRACE (A) 04/13/2018 1631   Sepsis Labs Invalid input(s): PROCALCITONIN,  WBC,  LACTICIDVEN Microbiology No results found for this or any previous visit (from the past 240 hour(s)).   Time coordinating discharge: Over 30  minutes  SIGNED:   Donnamarie Poag [place] DO  [facility] 04/26/2018, 2:34 PM

## 2018-04-26 NOTE — Progress Notes (Signed)
    CC: Abdominal pain  Subjective: She looks good this morning.  She had a bowel movement yesterday and better with more normal this morning.  She says she tired of grits.  Objective: Vital signs in last 24 hours: Temp:  [98.1 F (36.7 C)-98.8 F (37.1 C)] 98.8 F (37.1 C) (03/11 0515) Pulse Rate:  [70-78] 70 (03/11 0515) Resp:  [15-16] 15 (03/11 0515) BP: (155-171)/(70-75) 171/75 (03/11 0515) SpO2:  [98 %-100 %] 99 % (03/11 0515) Last BM Date: 04/25/18 1080 p.o. No IV fluids recorded 1002 urine BM x1 recorded Afebrile vital signs are stable Labs are stable.  Potassium up to 4.4, mag 1.9 Intake/Output from previous day: 03/10 0701 - 03/11 0700 In: 1080 [P.O.:1080] Out: 1002 [Urine:1002] Intake/Output this shift: Total I/O In: -  Out: 500 [Urine:500]  General appearance: alert, cooperative and no distress GI: soft, non-tender; bowel sounds normal; no masses,  no organomegaly  Lab Results:  Recent Labs    04/24/18 0355  WBC 7.7  HGB 10.8*  HCT 35.3*  PLT 303    BMET Recent Labs    04/25/18 0424 04/26/18 0518  NA 136 134*  K 3.4* 4.4  CL 106 106  CO2 24 23  GLUCOSE 102* 90  BUN <5* <5*  CREATININE 0.75 0.86  CALCIUM 8.8* 9.2   PT/INR No results for input(s): LABPROT, INR in the last 72 hours.  Recent Labs  Lab 04/21/18 1636 04/22/18 0422  AST 51* 38  ALT 70* 56*  ALKPHOS 67 59  BILITOT 1.0 0.7  PROT 6.7 6.0*  ALBUMIN 4.0 3.6     Lipase     Component Value Date/Time   LIPASE 73 (H) 04/21/2018 1636     Medications: . acidophilus  1 capsule Oral Daily  . fluticasone  1 spray Each Nare Q2000   And  . azelastine  1 spray Each Nare Q2000  . celecoxib  200 mg Oral Q breakfast  . cholecalciferol  1,000 Units Oral Daily  . enoxaparin (LOVENOX) injection  40 mg Subcutaneous Daily  . lidocaine  1 application Other Once  . lisinopril  10 mg Oral Daily  . multivitamin with minerals  1 tablet Oral Daily  . pantoprazole (PROTONIX) IV  40 mg  Intravenous Q24H  . polyethylene glycol  17 g Oral Daily  . potassium chloride  20 mEq Oral BID    Assessment/Plan Hypertension Hx breast cancer 1994 Hx hysterectomy/BSSO -uterine cancer1997 Hx diverticulitis-2012 Hx left nephrolithiasis Hypokalemia-replaced/resolved  Recurrent SBO(hospitalized 2/28-04/17/18) -readmit 04/21/2018   FEN: IV fluids/full liquids ID: None DVT: Lovenox Follow-up: To be determined   Plan: Give her a soft diet this morning for breakfast and lunch.  She does well she can go home after lunch later today.  I will put information in for a soft diet and good bowel habits.  LOS: 5 days    Ry Moody 04/26/2018 [phone]

## 2020-03-03 IMAGING — RF DG NASO G TUBE PLC W/FL W/RAD
1 series · 1 of 1 positions shown · IV contrast (agent unspecified)
Comparison: Abdomen and pelvis CT dated 04/13/2018

CLINICAL DATA: Unable to place a nasogastric tube on the floor due
to proximal obstruction. The tube is being placed for high-grade
small bowel obstruction seen on a CT yesterday.

EXAM:
NASO G TUBE PLACEMENT WITH FL AND WITH RAD
CONTRAST:  None used.
FLUOROSCOPY TIME:  Fluoroscopy Time:  0 minutes 24 seconds
Radiation Exposure Index (if provided by the fluoroscopic device):
3.0 mGy
Number of Acquired Spot Images: 0

[Series 1: cp_standard · 0.25mm/px · 1 of 1 slices shown]
[im 1/1]
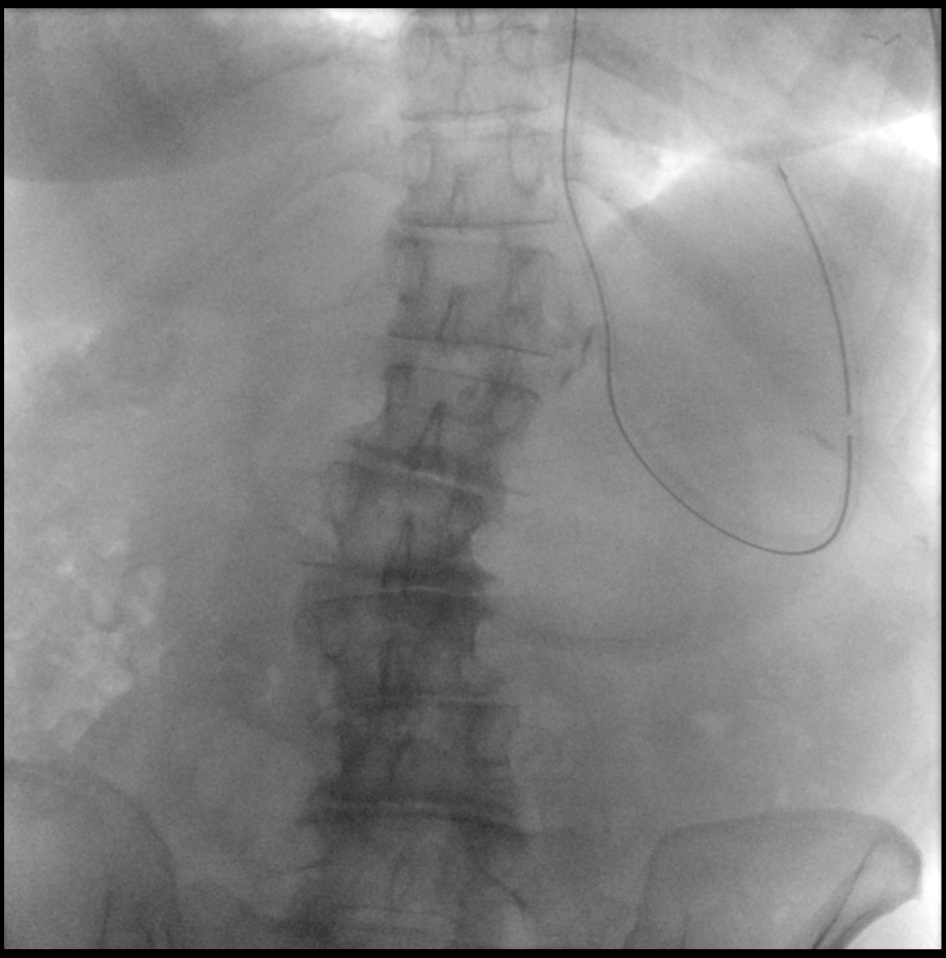

[1 of 1 positions shown; findings below may reference images not displayed]

FINDINGS: Under fluoroscopic guidance, a nasogastric tube was passed through
the patient's left nostril and into the stomach. The tip and side
hole were left in the proximal stomach. The patient tolerated the
procedure with no complications. The post placement image
demonstrates lumbar spine degenerative changes and scoliosis. No
dilated bowel loops are visible on that image with a paucity of
loops containing gas.
IMPRESSION: Successful fluoroscopic guided nasogastric tube placement, as
described above.

## 2020-03-04 IMAGING — DX DG ABD PORTABLE 1V
1 series · 1 of 1 positions shown · non-contrast
Comparison: 04/15/2018

CLINICAL DATA: 8 hour port abdomen

EXAM:
PORTABLE ABDOMEN - 1 VIEW

[abdomen kub]
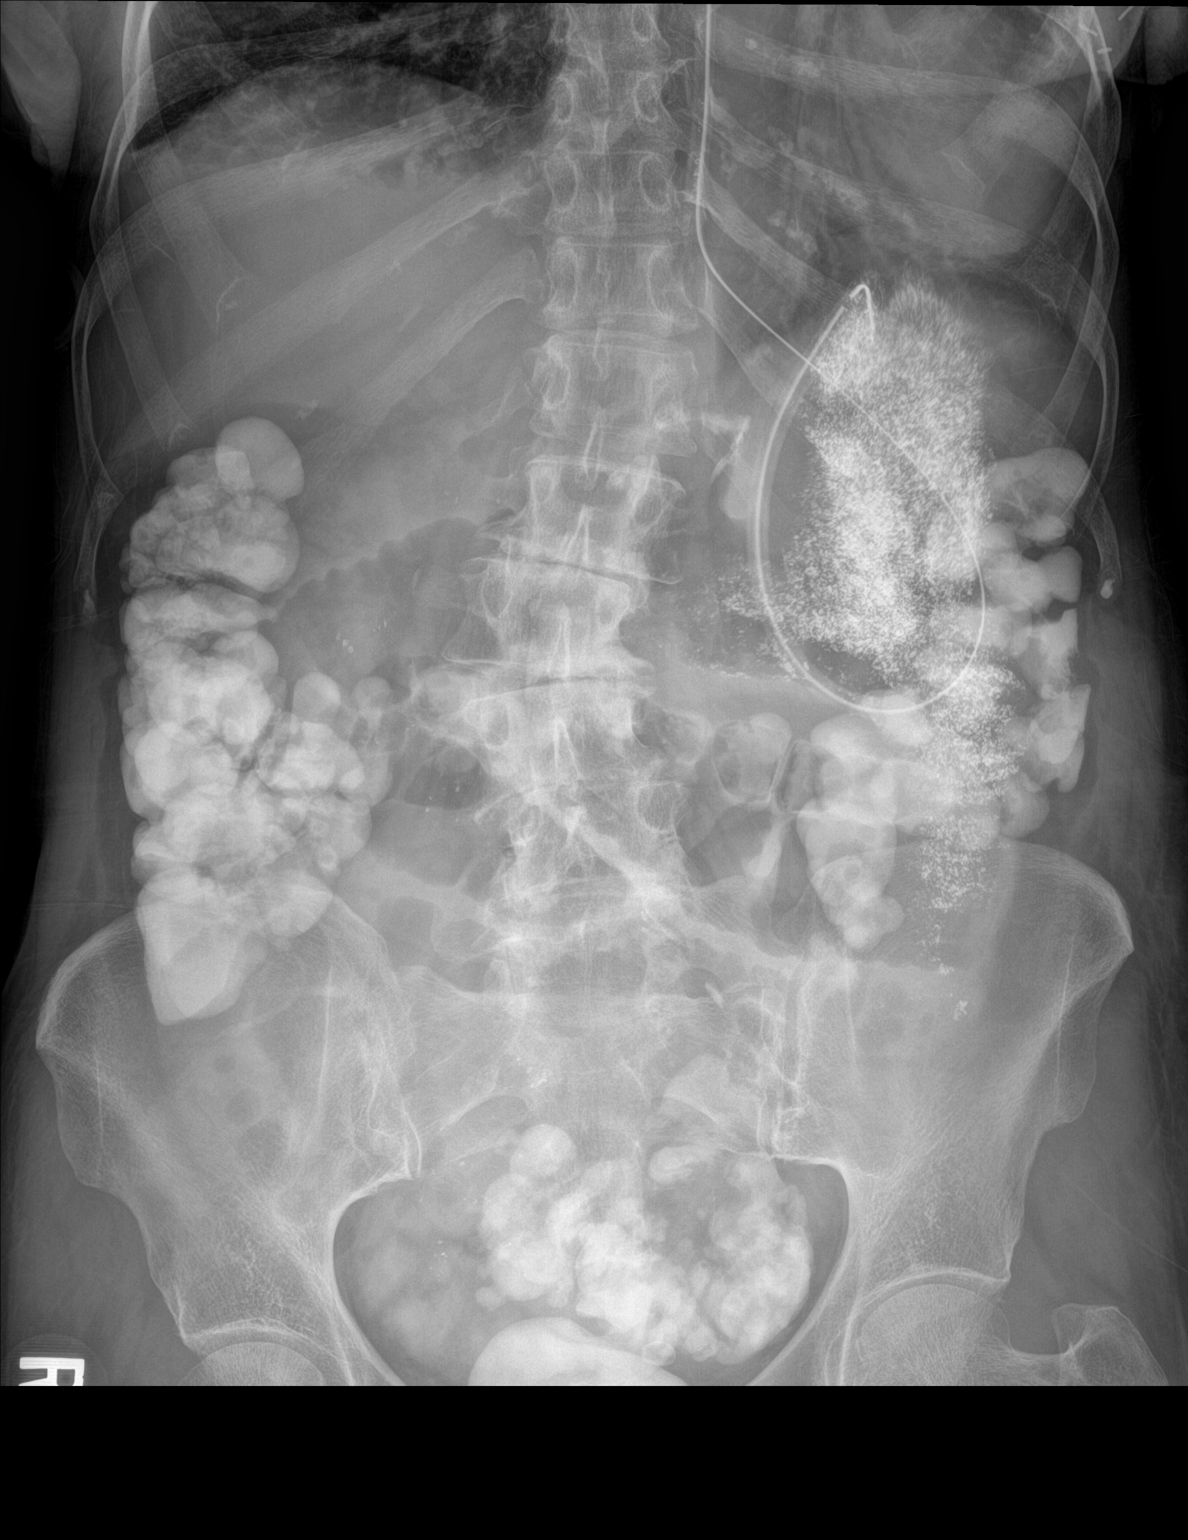

[1 of 1 positions shown; findings below may reference images not displayed]

FINDINGS: Nasogastric tube is in place. There is residual contrast in the
stomach and large bowel loops. There is persistent dilatation of
small bowel loops, partially obscured by overlying contrast. The
dilatation of small bowel appears slightly improved compared with
most recent exam. Contrast is identified within the rectum,
excluding complete obstruction.
IMPRESSION: Suspect slight improvement in the partial small bowel obstruction.
There is persistent dilatation of small bowel loops partially
obscured by the contrast.

## 2020-03-04 IMAGING — DX DG ABD PORTABLE 1V
1 series · 1 of 1 positions shown · non-contrast
Comparison: 04/14/2018

CLINICAL DATA: Small bowel obstruction. 8 hour delay.

EXAM:
PORTABLE ABDOMEN - 1 VIEW

[abdomen kub]
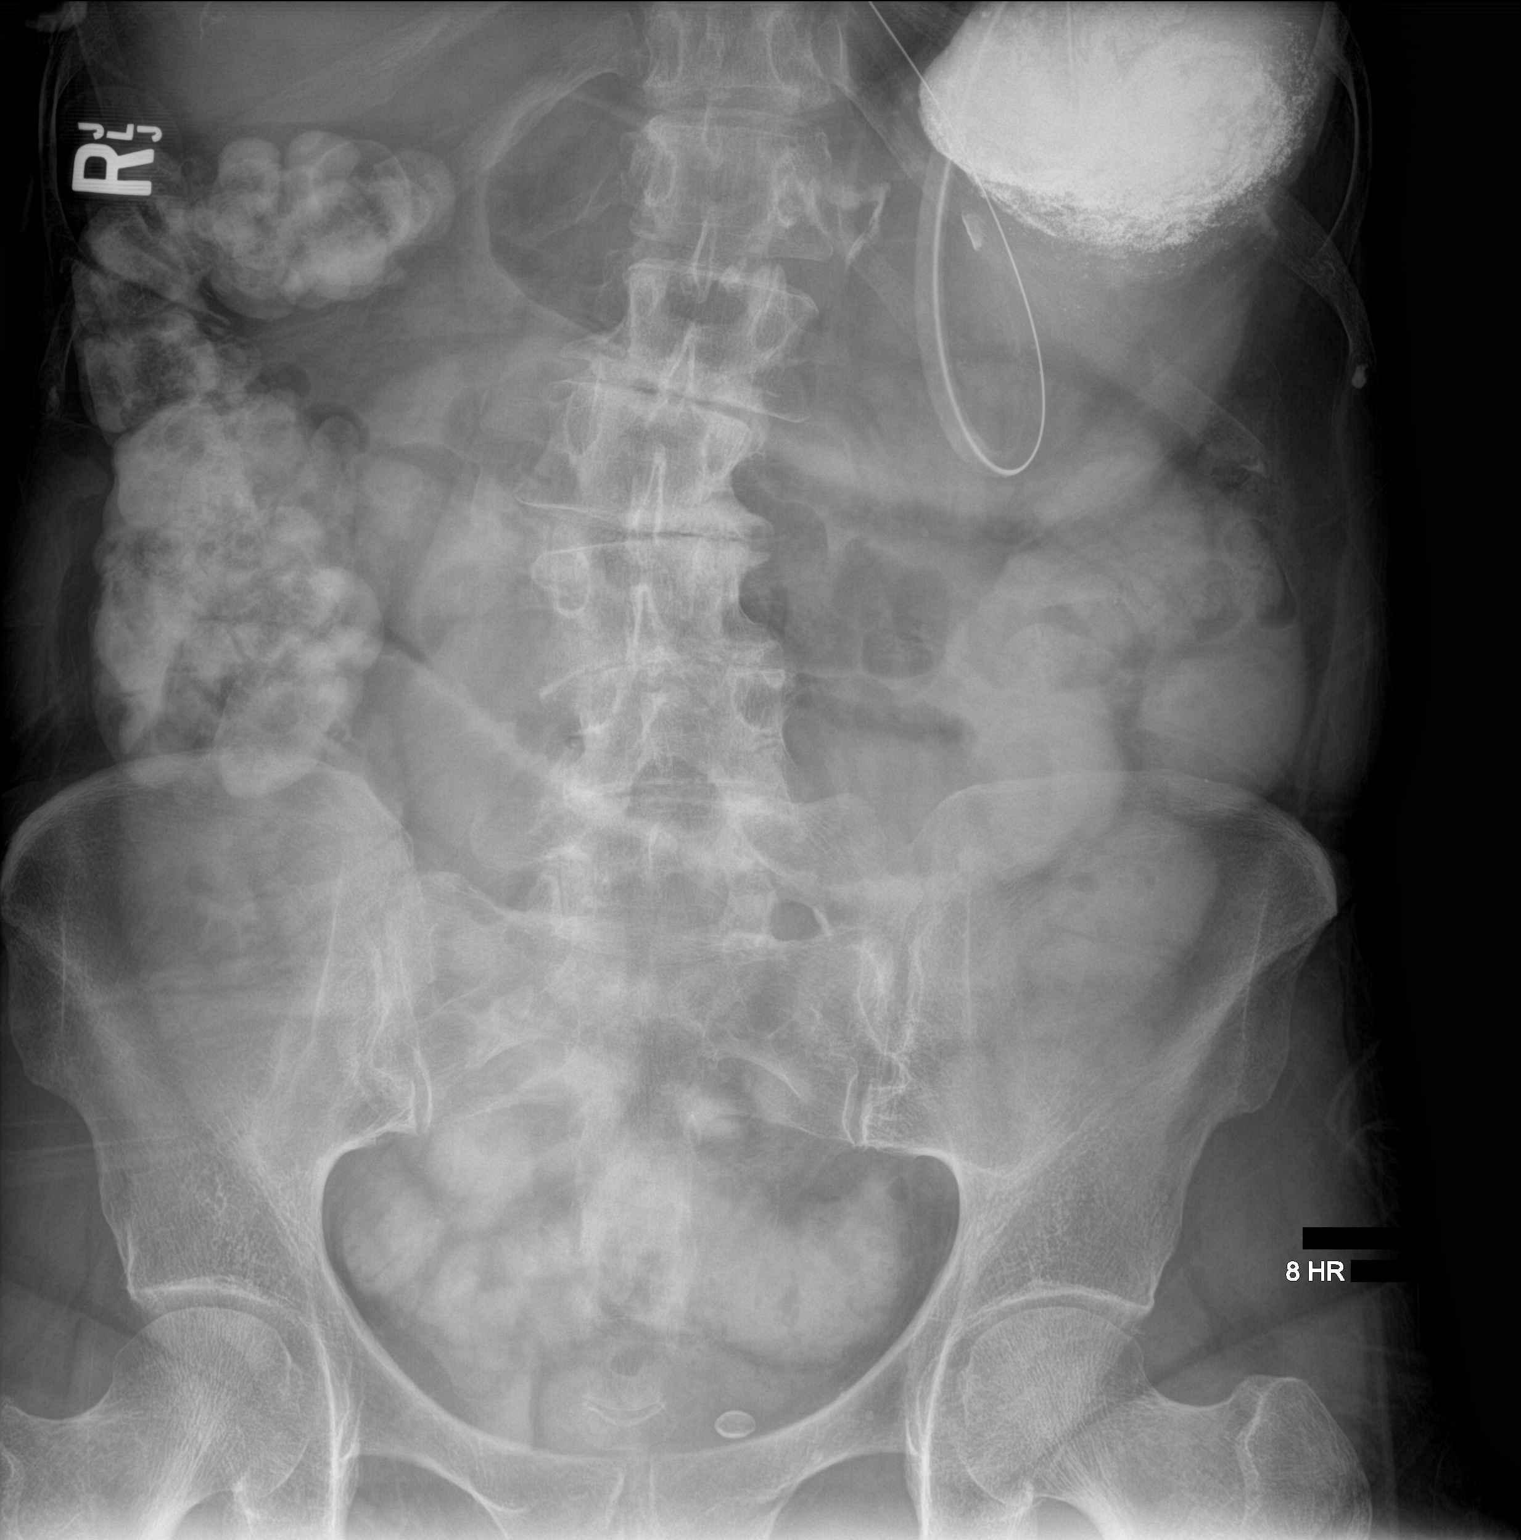

[1 of 1 positions shown; findings below may reference images not displayed]

FINDINGS: Contrast material is demonstrated in the stomach, small bowel, and
right colon. Presence of contrast material in the colon suggest
partial small bowel obstruction. Small bowel remain dilated with
evidence of wall thickening. Enteric tube in the left upper quadrant
consistent with location in the upper stomach. Degenerative changes
and scoliosis of the lumbar spine.
IMPRESSION: Dilated small bowel with contrast material in the colon suggesting
partial small bowel obstruction.

## 2020-03-05 IMAGING — DX DG ABD PORTABLE 1V
1 series · 1 of 1 positions shown · non-contrast
Comparison: 04/15/2018; abdominal CT-04/13/2018

CLINICAL DATA: Small-bowel obstruction.

EXAM:
PORTABLE ABDOMEN - 1 VIEW

[abdomen kub]
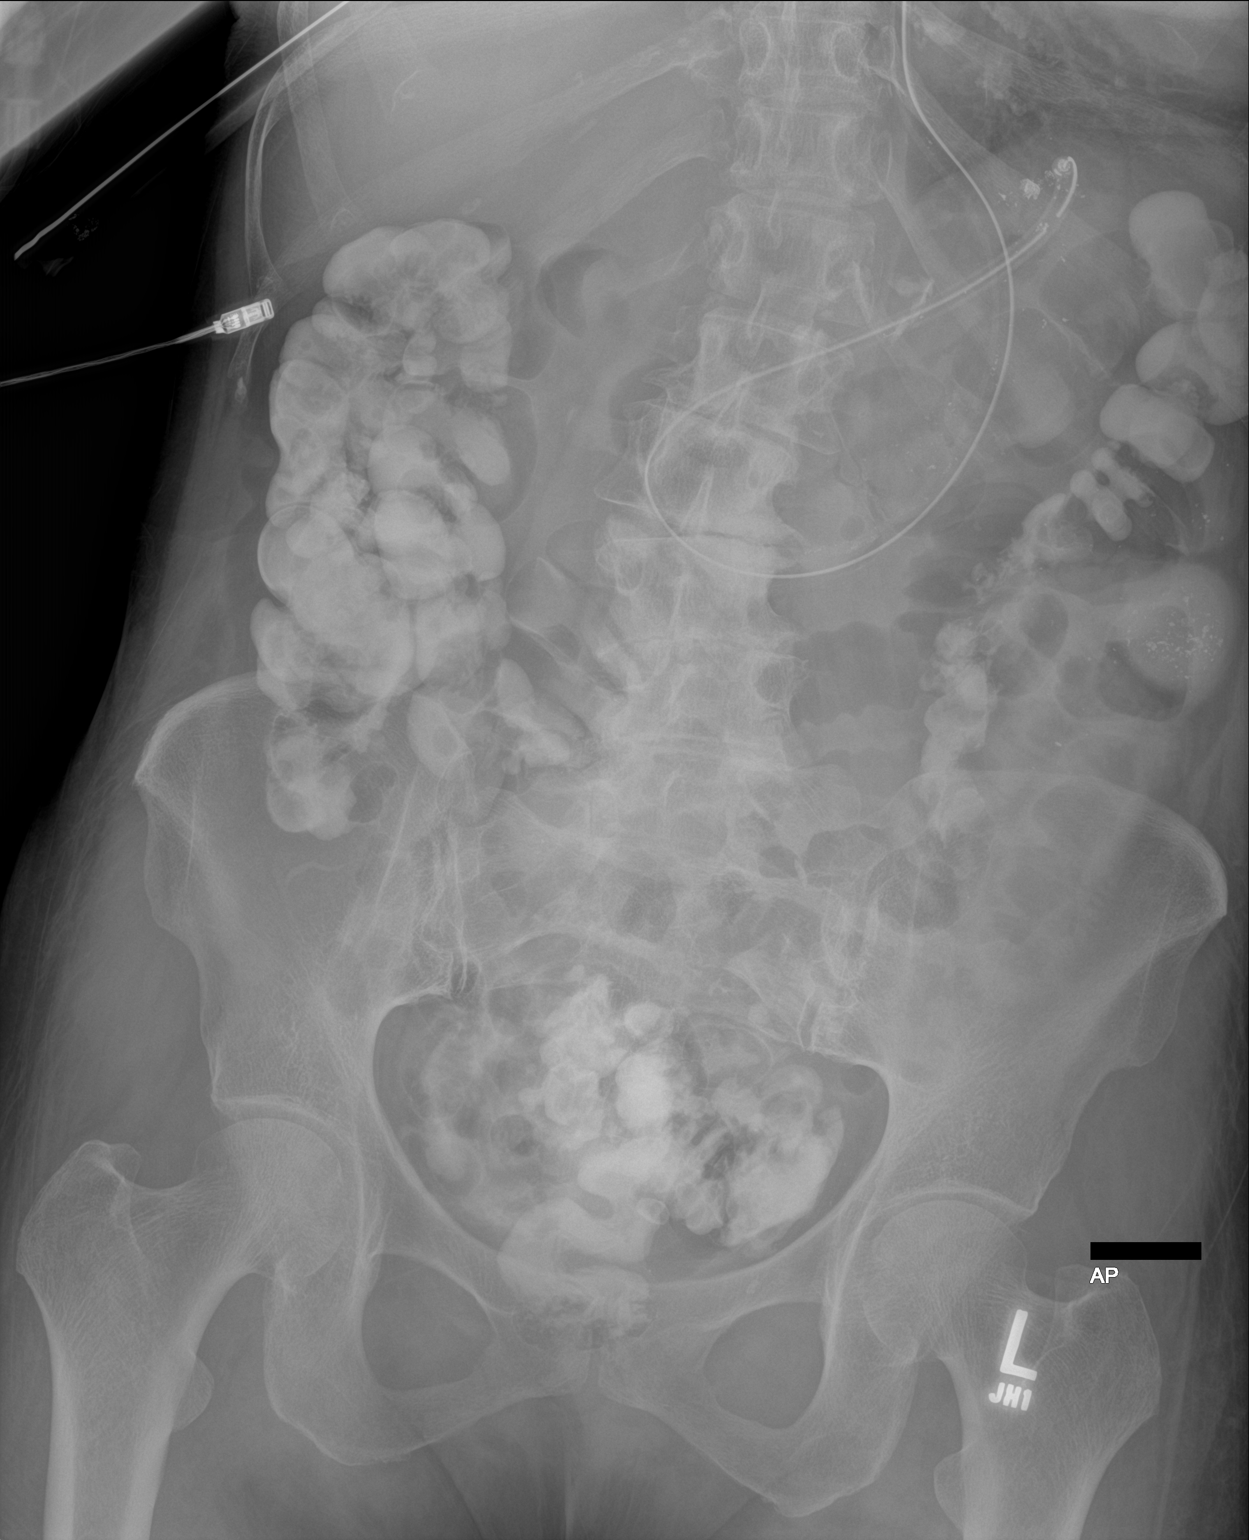

[1 of 1 positions shown; findings below may reference images not displayed]

FINDINGS: Persistent though improved mild distension of several patulous loops
of small bowel with index loop of small bowel in the left mid
hemiabdomen measuring 2.5 cm in diameter, previously, 3.7 cm. This
finding is associated with potential bowel wall thickening.

Enteric contrast remains within the colon, in a similar location
compared to the 04/15/2018 examination.

Enteric tube tip and side port again project over the expected
location of the gastric fundus.

Nondiagnostic evaluation for pneumoperitoneum secondary to supine
positioning and exclusion of the lower thorax. No pneumatosis or
portal venous gas.

Degenerative change of the lower lumbar spine, incompletely
evaluated.
IMPRESSION: Findings most suggestive of slightly improved though persistent
small-bowel obstruction with slight reduction of gaseous distention
of the small bowel though with persistent suspected small bowel wall
thickening and unchanged location of ingested enteric contrast
within the colon.

## 2022-06-07 ENCOUNTER — Emergency Department (HOSPITAL_COMMUNITY)
Admission: EM | Admit: 2022-06-07 | Discharge: 2022-06-08 | Disposition: A | Discharge status: Home / Self Care | Payer: Medicare Other | Attending: Emergency Medicine | Admitting: Emergency Medicine

## 2022-06-07 ENCOUNTER — Other Ambulatory Visit: Payer: Self-pay

## 2022-06-07 ENCOUNTER — Encounter (HOSPITAL_COMMUNITY): Payer: Self-pay

## 2022-06-07 DIAGNOSIS — I480 Paroxysmal atrial fibrillation: Secondary | ICD-10-CM | POA: Diagnosis not present

## 2022-06-07 DIAGNOSIS — R002 Palpitations: Secondary | ICD-10-CM | POA: Diagnosis present

## 2022-06-07 DIAGNOSIS — Z7901 Long term (current) use of anticoagulants: Secondary | ICD-10-CM | POA: Insufficient documentation

## 2022-06-07 MED ORDER — METOPROLOL SUCCINATE ER 25 MG PO TB24
12.5000 mg | ORAL_TABLET | Freq: Every day | ORAL | Status: DC
  Administered 2022-06-07: 12.5 mg via ORAL
  Filled 2022-06-07: qty 1

## 2022-06-07 MED ORDER — SODIUM CHLORIDE 0.9 % IV BOLUS
1000.0000 mL | Freq: Once | INTRAVENOUS | Status: AC
  Administered 2022-06-07: 1000 mL via INTRAVENOUS

## 2022-06-07 MED ORDER — APIXABAN 2.5 MG PO TABS: 2.5000 mg | ORAL_TABLET | Freq: Two times a day (BID) | ORAL | 0 refills | Status: DC

## 2022-06-07 MED ORDER — METOPROLOL SUCCINATE ER 25 MG PO TB24: 12.5000 mg | ORAL_TABLET | Freq: Every day | ORAL | 0 refills | Status: DC

## 2022-06-07 MED ORDER — APIXABAN 2.5 MG PO TABS
2.5000 mg | ORAL_TABLET | Freq: Two times a day (BID) | ORAL | Status: DC
  Administered 2022-06-07: 2.5 mg via ORAL
  Filled 2022-06-07: qty 1

## 2022-06-07 NOTE — ED Triage Notes (Signed)
Pt complaining of palpitations that started over a week ago. Did go to the doctor today and had an EKG done that showed a-fib. Is a new diagnoses. Still feeling the palpitations. [employer]

## 2022-06-07 NOTE — Progress Notes (Signed)
ANTICOAGULATION CONSULT NOTE - Initial Consult  Pharmacy Consult for Eliquis Indication: atrial fibrillation  Allergies  Allergen Reactions   Other Swelling and Other (See Comments)    "eye drops" (PVA)    Patient Measurements: Height: 5' (152.4 cm) Weight: 50.8 kg (112 lb) IBW/kg (Calculated) : 45.5  Vital Signs: Temp: 98.1 F (36.7 C) (04/22 2124) Temp Source: Oral (04/22 2124) BP: 195/95 (04/22 2124) Pulse Rate: 110 (04/22 2124)  Labs: No results for input(s): "HGB", "HCT", "PLT", "APTT", "LABPROT", "INR", "HEPARINUNFRC", "HEPRLOWMOCWT", "CREATININE", "CKTOTAL", "CKMB", "TROPONINIHS" in the last 72 hours.  CrCl cannot be calculated (Patient's most recent lab result is older than the maximum 21 days allowed.).   Medical History: Past Medical History:  Diagnosis Date   Breast cancer    Diverticulitis    Hypertension    Uterine cancer    Assessment: 32 YOF presenting with palpitations, in Afib, she is not on anticoagulation PTA.  Age 80, Wt 50 kg  Goal of Therapy:  Monitor platelets by anticoagulation protocol: Yes   Plan:  Eliquis 2.5 mg PO BID Monitor s/s bleeding  Daylene Posey, PharmD, [facility] Clinical Pharmacist ED Pharmacist Phone # [phone] 06/07/2022 9:59 PM

## 2022-06-07 NOTE — ED Provider Notes (Signed)
[place] EMERGENCY DEPARTMENT AT [facility] Provider Note   CSN: [id number] Arrival date & time: 06/07/22  2117     History  Chief Complaint  Patient presents with   Palpitations    Chelsea Petersen is a 82 y.o. female.  82 yo F with a chief complaints of palpitations.  This been going on for a couple weeks.  The patient was notified by her fitness watch that this had been ongoing.  She noticed it from time to time but did not think much of it.  She had a appointment with her primary care provider today and they found her to be in an irregularly irregular rhythm.  They ordered an EKG that did not occur until this evening and whenever provider was notified encouraged her to come to the ED for evaluation.  She denies any difficulty breathing or chest discomfort.  She has had frequent bowel movements which is not uncommon for her.  She is on a regiment after she had had a bowel obstruction in the past.  She denies any abdominal pain denies dark stool or blood in her stool denies cough congestion or fever.   Palpitations      Home Medications Prior to Admission medications   Medication Sig Start Date End Date Taking? Authorizing Provider  apixaban (ELIQUIS) 2.5 MG TABS tablet Take 1 tablet (2.5 mg total) by mouth 2 (two) times daily. 06/07/22 07/07/22 Yes Melene Plan, DO  metoprolol succinate (TOPROL-XL) 25 MG 24 hr tablet Take 0.5 tablets (12.5 mg total) by mouth daily. 06/07/22 07/07/22 Yes Melene Plan, DO  ACIDOPHILUS LACTOBACILLUS PO Take 1 capsule by mouth daily.    [provider]  celecoxib (CELEBREX) 200 MG capsule Take 200 mg by mouth daily with breakfast. 03/10/18   [provider]  Cholecalciferol (VITAMIN D3) 25 MCG (1000 UT) CAPS Take 1,000 Units by mouth daily.    [provider]  DYMISTA 137-50 MCG/ACT SUSP Place 1 spray into both nostrils every evening. 02/06/18   [provider]  lisinopril (PRINIVIL,ZESTRIL) 10 MG tablet Take 10  mg by mouth daily. 02/10/18   [provider]  Multiple Vitamin (MULTIVITAMIN) capsule Take 1 capsule by mouth daily.    [provider]      Allergies    Other    Review of Systems   Review of Systems  Cardiovascular:  Positive for palpitations.    Physical Exam Updated Vital Signs BP (!) 166/79 (BP Location: Right Arm)   Pulse 95   Temp 98.5 F (36.9 C) (Oral)   Resp 17   Ht 5' (1.524 m)   Wt 50.8 kg   SpO2 99%   BMI 21.87 kg/m  Physical Exam Vitals and nursing note reviewed.  Constitutional:      General: She is not in acute distress.    Appearance: She is well-developed. She is not diaphoretic.  HENT:     Head: Normocephalic and atraumatic.  Eyes:     Pupils: Pupils are equal, round, and reactive to light.  Cardiovascular:     Rate and Rhythm: Normal rate. Rhythm irregular.     Heart sounds: No murmur heard.    No friction rub. No gallop.  Pulmonary:     Effort: Pulmonary effort is normal.     Breath sounds: No wheezing or rales.  Abdominal:     General: There is no distension.     Palpations: Abdomen is soft.     Tenderness: There is no abdominal  tenderness.  Musculoskeletal:        General: No tenderness.     Cervical back: Normal range of motion and neck supple.  Skin:    General: Skin is warm and dry.  Neurological:     Mental Status: She is alert and oriented to person, place, and time.  Psychiatric:        Behavior: Behavior normal.     ED Results / Procedures / Treatments   Labs (all labs ordered are listed, but only abnormal results are displayed) Labs Reviewed  CBC WITH DIFFERENTIAL/PLATELET  BASIC METABOLIC PANEL  MAGNESIUM    EKG EKG Interpretation  Date/Time:  Monday June 07 2022 21:25:37 EDT Ventricular Rate:  106 PR Interval:    QRS Duration: 175 QT Interval:  398 QTC Calculation: 496 R Axis:   73 Text Interpretation: Atrial flutter Right bundle branch block Left ventricular hypertrophy Repol abnrm  suggests ischemia, diffuse leads No significant change since last tracing Confirmed by Melene Plan [phone]) on 06/07/2022 9:36:44 PM  Radiology No results found.  Procedures Procedures    Medications Ordered in ED Medications  metoprolol succinate (TOPROL-XL) 24 hr tablet 12.5 mg (12.5 mg Oral Given 06/07/22 2235)  apixaban (ELIQUIS) tablet 2.5 mg (2.5 mg Oral Given 06/07/22 2235)  sodium chloride 0.9 % bolus 1,000 mL (1,000 mLs Intravenous New Bag/Given 06/07/22 2234)    ED Course/ Medical Decision Making/ A&P                             Medical Decision Making Amount and/or Complexity of Data Reviewed Labs: ordered.  Risk Prescription drug management.   82 yo F with a chief complaints of palpitations.  Going on for a couple weeks now.  Patient in A-fib on the monitor here.  Was sent by her primary care clinic at her independent living facility.  She is well-appearing nontoxic.  Essentially is rate controlled without any medications in the low 100s here on the monitor.  Will start on a low-dose of metoprolol.  Her CHA2DS2-VASc score is a 4 so talked with her about starting Eliquis as well.  Will obtain a laboratory evaluation here to assess for acute electrolyte derangements.  Will give information to follow-up with A-fib clinic.  Signed out to Dr. Judd Lien, please see their note for further details of care in the ED.  The patients results and plan were reviewed and discussed.   Any x-rays performed were independently reviewed by myself.   Differential diagnosis were considered with the presenting HPI.  Medications  metoprolol succinate (TOPROL-XL) 24 hr tablet 12.5 mg (12.5 mg Oral Given 06/07/22 2235)  apixaban (ELIQUIS) tablet 2.5 mg (2.5 mg Oral Given 06/07/22 2235)  sodium chloride 0.9 % bolus 1,000 mL (1,000 mLs Intravenous New Bag/Given 06/07/22 2234)    Vitals:   06/07/22 2124 06/07/22 2125 06/07/22 2249  BP: (!) 195/95  (!) 166/79  Pulse: (!) 110  95  Resp: 18  17  Temp:  98.1 F (36.7 C)  98.5 F (36.9 C)  TempSrc: Oral  Oral  SpO2: 98%  99%  Weight:  50.8 kg   Height:  5' (1.524 m)     Final diagnoses:  Paroxysmal atrial fibrillation     CHA2DS2/VAS Stroke Risk Points      N/A >= 2 Points: High Risk  1 - 1.99 Points: Medium Risk  0 Points: Low Risk    Last Change: N/A  This score determines the patient's risk of having a stroke if the  patient has atrial fibrillation.      This score is not applicable to this patient. Components are not  calculated.            Final Clinical Impression(s) / ED Diagnoses Final diagnoses:  Paroxysmal atrial fibrillation    Rx / DC Orders ED Discharge Orders          Ordered    metoprolol succinate (TOPROL-XL) 25 MG 24 hr tablet  Daily        06/07/22 2313    apixaban (ELIQUIS) 2.5 MG TABS tablet  2 times daily        06/07/22 2313              Melene Plan, DO 06/07/22 2324

## 2022-06-07 NOTE — Discharge Instructions (Signed)
Follow up with the afib clinic in the office.   Begin taking metoprolol and Eliquis as prescribed.

## 2022-06-08 ENCOUNTER — Telehealth (HOSPITAL_COMMUNITY): Payer: Self-pay

## 2022-06-08 DIAGNOSIS — I480 Paroxysmal atrial fibrillation: Secondary | ICD-10-CM | POA: Diagnosis not present

## 2022-06-08 LAB — BASIC METABOLIC PANEL
Anion gap: 6 (ref 5–15)
BUN: 13 mg/dL (ref 8–23)
CO2: 23 mmol/L (ref 22–32)
Calcium: 9.1 mg/dL (ref 8.9–10.3)
Chloride: 102 mmol/L (ref 98–111)
Creatinine, Ser: 0.98 mg/dL (ref 0.44–1.00)
GFR, Estimated: 57 mL/min — ABNORMAL LOW (ref >60)
Glucose, Bld: 112 mg/dL — ABNORMAL HIGH (ref 70–99)
Potassium: 4.1 mmol/L (ref 3.5–5.1)
Sodium: 131 mmol/L — ABNORMAL LOW (ref 135–145)

## 2022-06-08 LAB — CBC WITH DIFFERENTIAL/PLATELET
Abs Immature Granulocytes: 0.02 10*3/uL (ref 0.00–0.07)
Basophils Absolute: 0.1 10*3/uL (ref 0.0–0.1)
Basophils Relative: 1 %
Eosinophils Absolute: 0.1 10*3/uL (ref 0.0–0.5)
Eosinophils Relative: 1 %
HCT: 36.1 % (ref 36.0–46.0)
Hemoglobin: 12.1 g/dL (ref 12.0–15.0)
Immature Granulocytes: 0 %
Lymphocytes Relative: 16 %
Lymphs Abs: 1.2 10*3/uL (ref 0.7–4.0)
MCH: 32.4 pg (ref 26.0–34.0)
MCHC: 33.5 g/dL (ref 30.0–36.0)
MCV: 96.5 fL (ref 80.0–100.0)
Monocytes Absolute: 0.7 10*3/uL (ref 0.1–1.0)
Monocytes Relative: 9 %
Neutro Abs: 5.3 10*3/uL (ref 1.7–7.7)
Neutrophils Relative %: 73 %
Platelets: 273 10*3/uL (ref 150–400)
RBC: 3.74 MIL/uL — ABNORMAL LOW (ref 3.87–5.11)
RDW: 12.2 % (ref 11.5–15.5)
WBC: 7.3 10*3/uL (ref 4.0–10.5)
nRBC: 0 % (ref 0.0–0.2)

## 2022-06-08 LAB — MAGNESIUM: Magnesium: 2 mg/dL (ref 1.7–2.4)

## 2022-06-08 NOTE — ED Notes (Signed)
Ptar called pt is 3rd in line for pickup.

## 2022-06-08 NOTE — Telephone Encounter (Signed)
Reached out to patient to schedule ED f/u appointment. No answer left voicemail. 

## 2022-06-08 NOTE — ED Provider Notes (Signed)
  Physical Exam  BP (!) 184/94   Pulse (!) 115   Temp 98.5 F (36.9 C) (Oral)   Resp 19   Ht 5' (1.524 m)   Wt 50.8 kg   SpO2 92%   BMI 21.87 kg/m   Physical Exam Vitals and nursing note reviewed.  Constitutional:      General: She is not in acute distress.    Appearance: She is well-developed. She is not diaphoretic.  HENT:     Head: Normocephalic and atraumatic.  Cardiovascular:     Rate and Rhythm: Rhythm irregular.  Pulmonary:     Effort: Pulmonary effort is normal.  Musculoskeletal:        General: Normal range of motion.     Cervical back: Normal range of motion and neck supple.  Skin:    General: Skin is warm and dry.  Neurological:     General: No focal deficit present.     Mental Status: She is alert and oriented to person, place, and time.     Procedures  Procedures  ED Course / MDM    Medical Decision Making Amount and/or Complexity of Data Reviewed Labs: ordered.  Risk Prescription drug management.   Care assumed from Dr. Adela Lank at shift change.  Patient presenting here with irregular heartbeat notifications on her fitness watch.  She was found to be in atrial fibrillation.  Care was signed out to me awaiting results of laboratory studies to rule out electrolyte disturbance.  CBC, metabolic panel, and magnesium have returned and are all basically unremarkable.  Patient reassessed and appears clinically well.  I feel as though she can safely be discharged with beta-blocker and Eliquis.  She will be followed up by cardiology as an outpatient.       Geoffery Lyons, MD 06/08/22 [phone]

## 2022-06-30 ENCOUNTER — Ambulatory Visit (HOSPITAL_COMMUNITY)
Admission: RE | Admit: 2022-06-30 | Discharge: 2022-06-30 | Disposition: A | Discharge status: Home / Self Care | Payer: Medicare Other | Source: Ambulatory Visit | Attending: Internal Medicine | Admitting: Internal Medicine

## 2022-06-30 VITALS — BP 152/82 | HR 72 | Ht 60.0 in | Wt 109.8 lb

## 2022-06-30 DIAGNOSIS — Z853 Personal history of malignant neoplasm of breast: Secondary | ICD-10-CM | POA: Insufficient documentation

## 2022-06-30 DIAGNOSIS — Z8542 Personal history of malignant neoplasm of other parts of uterus: Secondary | ICD-10-CM | POA: Insufficient documentation

## 2022-06-30 DIAGNOSIS — D6869 Other thrombophilia: Secondary | ICD-10-CM | POA: Diagnosis not present

## 2022-06-30 DIAGNOSIS — I4891 Unspecified atrial fibrillation: Secondary | ICD-10-CM

## 2022-06-30 DIAGNOSIS — Z79899 Other long term (current) drug therapy: Secondary | ICD-10-CM | POA: Diagnosis not present

## 2022-06-30 DIAGNOSIS — Z90710 Acquired absence of both cervix and uterus: Secondary | ICD-10-CM | POA: Diagnosis not present

## 2022-06-30 DIAGNOSIS — R0683 Snoring: Secondary | ICD-10-CM | POA: Diagnosis not present

## 2022-06-30 DIAGNOSIS — I4819 Other persistent atrial fibrillation: Secondary | ICD-10-CM

## 2022-06-30 DIAGNOSIS — I483 Typical atrial flutter: Secondary | ICD-10-CM | POA: Diagnosis not present

## 2022-06-30 DIAGNOSIS — I4439 Other atrioventricular block: Secondary | ICD-10-CM | POA: Diagnosis not present

## 2022-06-30 DIAGNOSIS — I484 Atypical atrial flutter: Secondary | ICD-10-CM | POA: Diagnosis not present

## 2022-06-30 DIAGNOSIS — Z7901 Long term (current) use of anticoagulants: Secondary | ICD-10-CM | POA: Insufficient documentation

## 2022-06-30 DIAGNOSIS — I4892 Unspecified atrial flutter: Secondary | ICD-10-CM | POA: Diagnosis present

## 2022-06-30 MED ORDER — APIXABAN 2.5 MG PO TABS: 2.5000 mg | ORAL_TABLET | Freq: Two times a day (BID) | ORAL | 3 refills | Status: DC

## 2022-06-30 MED ORDER — METOPROLOL SUCCINATE ER 25 MG PO TB24: 12.5000 mg | ORAL_TABLET | Freq: Every day | ORAL | 3 refills | Status: DC

## 2022-06-30 NOTE — Progress Notes (Signed)
Primary Care Physician: Felipa Furnace, NP Primary Cardiologist: None Primary Electrophysiologist: None Referring Physician: [facility]   Chelsea Petersen is a 82 y.o. female with a history of HTN, remote history of breast cancer and uterine cancer s/p hysterectomy, diverticulitis, SBO, and atrial fibrillation who presents for consultation in the [facility]. The patient was initially diagnosed with atrial fibrillation on ED visit 06/07/22 when she was evaluated due to palpitations for a couple of weeks. She notes irregular heartbeat notifications on her fitness watch. She was in Afib with RVR in ED (low 100s without rate control medication). Discharged on Toprol 12.5 mg qhs and Eliquis 2.5 mg BID. Patient is on Eliquis 2.5 mg BID for a CHADS2VASC score of 4. She is eligible to take lower dose of Eliquis due to age and weight.   On evaluation today, she is currently in rate controlled atrial flutter. She does notice shortness of breath with exertion. Her Fitbit watch will provide HR alerts but not a rhythm strip.   She does not drink alcohol or excessive amounts of caffeine. She snores some but no stop breathing; feels well rested after she wakes up.   She is compliant with anticoagulation and has not missed any doses. She has no bleeding concerns.  Today, she denies symptoms of palpitations, chest pain, shortness of breath, orthopnea, PND, lower extremity edema, dizziness, presyncope, syncope, snoring, daytime somnolence, bleeding, or neurologic sequela. The patient is tolerating medications without difficulties and is otherwise without complaint today.   Atrial Fibrillation Risk Factors:  she does not have symptoms or diagnosis of sleep apnea. she does not have a history of rheumatic fever. she does not have a history of alcohol use. The patient does not have a history of early familial atrial fibrillation or other arrhythmias.  she has a BMI of Body mass  index is 21.44 kg/m.Marland Kitchen Filed Weights   06/30/22 1052  Weight: 49.8 kg    Family History  Problem Relation Age of Onset   Hypertension Mother    Colon cancer Neg Hx    Breast cancer Neg Hx      Atrial Fibrillation Management history:  Previous antiarrhythmic drugs: None Previous cardioversions: None Previous ablations: None Anticoagulation history: Eliquis 2.5 mg BID   Past Medical History:  Diagnosis Date   Breast cancer (HCC)    Diverticulitis    Hypertension    Uterine cancer (HCC)    Past Surgical History:  Procedure Laterality Date   ABDOMINAL HYSTERECTOMY     BREAST LUMPECTOMY     EYE SURGERY      Current Outpatient Medications  Medication Sig Dispense Refill   ACIDOPHILUS LACTOBACILLUS PO Take 1 capsule by mouth daily.     betamethasone dipropionate 0.05 % lotion Apply 1 to 2 times a day under fingernail as needed for detachment     celecoxib (CELEBREX) 200 MG capsule Take 200 mg by mouth daily with breakfast.     Cholecalciferol (VITAMIN D3) 25 MCG (1000 UT) CAPS Take 1,000 Units by mouth daily.     citalopram (CELEXA) 10 MG tablet Take 10 mg by mouth daily.     guaiFENesin (MUCINEX) 600 MG 12 hr tablet Take 1 tablet by mouth as needed.     lisinopril (PRINIVIL,ZESTRIL) 10 MG tablet Take 10 mg by mouth daily.     methylcellulose (CITRUCEL) oral powder Take by mouth daily.     Multiple Vitamin (MULTIVITAMIN) capsule Take 1 capsule by mouth daily.  polyethylene glycol (MIRALAX / GLYCOLAX) 17 g packet Take 17 g by mouth every other day.     triamcinolone (NASACORT) 55 MCG/ACT AERO nasal inhaler TWO SPRAYS INTO EACH NOSTRIL DAILY     apixaban (ELIQUIS) 2.5 MG TABS tablet Take 1 tablet (2.5 mg total) by mouth 2 (two) times daily. 60 tablet 3   metoprolol succinate (TOPROL-XL) 25 MG 24 hr tablet Take 0.5 tablets (12.5 mg total) by mouth daily. 15 tablet 3   No current facility-administered medications for this encounter.    Allergies  Allergen Reactions    Other Swelling and Other (See Comments)    "eye drops" (PVA)    Social History   Socioeconomic History   Marital status: Single    Spouse name: Not on file   Number of children: Not on file   Years of education: Not on file   Highest education level: Not on file  Occupational History   Not on file  Tobacco Use   Smoking status: Never   Smokeless tobacco: Never  Vaping Use   Vaping Use: Never used  Substance and Sexual Activity   Alcohol use: Yes    Comment: occ   Drug use: Never   Sexual activity: Not on file  Other Topics Concern   Not on file  Social History Narrative   Not on file   Social Determinants of Health   Financial Resource Strain: Not on file  Food Insecurity: Not on file  Transportation Needs: Not on file  Physical Activity: Not on file  Stress: Not on file  Social Connections: Not on file  Intimate Partner Violence: Not on file   ROS- All systems are reviewed and negative except as per the HPI above.  Physical Exam: Vitals:   06/30/22 1052  BP: (!) 152/82  Pulse: 72  Weight: 49.8 kg  Height: 5' (1.524 m)    GEN- The patient is a well appearing female, alert and oriented x 3 today.   Head- normocephalic, atraumatic Eyes-  Sclera clear, conjunctiva pink Ears- hearing intact Oropharynx- clear Neck- supple  Lungs- Clear to ausculation bilaterally, normal work of breathing Heart- Irregular rate and rhythm, no murmurs, rubs or gallops  GI- soft, NT, ND, + BS Extremities- no clubbing, cyanosis, or edema MS- no significant deformity or atrophy Skin- no rash or lesion Psych- euthymic mood, full affect Neuro- strength and sensation are intact  Wt Readings from Last 3 Encounters:  06/30/22 49.8 kg  06/07/22 50.8 kg  04/21/18 50.8 kg    EKG today demonstrates  Vent. rate 72 BPM PR interval * ms QRS duration 82 ms QT/QTcB 432/473 ms P-R-T axes 94 94 90  Suspect arm lead reversal, interpretation assumes no reversal Atrial flutter with  variable A-V block Rightward axis Biventricular hypertrophy Nonspecific ST and T wave abnormality Prolonged QT Abnormal ECG When compared with ECG of 07-Jun-2022 21:25, PREVIOUS ECG IS PRESENT  Echo N/A  Epic records are reviewed at length today.  CHA2DS2-VASc Score = 4  The patient's score is based upon: CHF History: 0 HTN History: 1 Diabetes History: 0 Stroke History: 0 Vascular Disease History: 0 Age Score: 2 Gender Score: 1       ASSESSMENT AND PLAN: Persistent atrial flutter The patient's CHA2DS2-VASc score is 4, indicating a 4.8% annual risk of stroke.    She is currently in rate controlled atrial flutter.   Education provided about atrial flutter with visual diagram. Discussion about medication treatments and ablation in detail. Confirmed with EP  her two prior ECGs are atrial flutter as well. If DCCV is unsuccessful in maintaining NSR, can consider EP referral to discuss atrial flutter ablation. After discussion, we will proceed with scheduling DCCV at this time.  - Schedule DCCV. - Obtain baseline echo. - F/u 1-2 weeks after DCCV.  - Continue current medications without change.   Secondary Hypercoagulable State (ICD10:  D68.69) The patient is at significant risk for stroke/thromboembolism based upon her CHA2DS2-VASc Score of 4.  Continue Apixaban (Eliquis). She meets criteria for Eliquis 2.5 mg BID dosage. Education on compliance with anticoagulation provided. I did advise she discontinue Celebrex due to interaction with Eliquis.   4. Snoring with concern for Obstructive sleep apnea We will defer at this time; she does not appear to have symptoms concerning for OSA.    Follow up 1-2 weeks after DCCV.    Lake Bells, PA-C Afib Clinic [facility] [address] [place], [place] [address] [phone] 06/30/2022 11:57 AM

## 2022-06-30 NOTE — H&P (View-Only) (Signed)
  Primary Care Physician: Clapp, Amber D, NP Primary Cardiologist: None Primary Electrophysiologist: None Referring Physician: [facility]   Chelsea Petersen is a 81 y.o. female with a history of HTN, remote history of breast cancer and uterine cancer s/p hysterectomy, diverticulitis, SBO, and atrial fibrillation who presents for consultation in the [facility]. The patient was initially diagnosed with atrial fibrillation on ED visit 06/07/22 when she was evaluated due to palpitations for a couple of weeks. She notes irregular heartbeat notifications on her fitness watch. She was in Afib with RVR in ED (low 100s without rate control medication). Discharged on Toprol 12.5 mg qhs and Eliquis 2.5 mg BID. Patient is on Eliquis 2.5 mg BID for a CHADS2VASC score of 4. She is eligible to take lower dose of Eliquis due to age and weight.   On evaluation today, she is currently in rate controlled atrial flutter. She does notice shortness of breath with exertion. Her Fitbit watch will provide HR alerts but not a rhythm strip.   She does not drink alcohol or excessive amounts of caffeine. She snores some but no stop breathing; feels well rested after she wakes up.   She is compliant with anticoagulation and has not missed any doses. She has no bleeding concerns.  Today, she denies symptoms of palpitations, chest pain, shortness of breath, orthopnea, PND, lower extremity edema, dizziness, presyncope, syncope, snoring, daytime somnolence, bleeding, or neurologic sequela. The patient is tolerating medications without difficulties and is otherwise without complaint today.   Atrial Fibrillation Risk Factors:  she does not have symptoms or diagnosis of sleep apnea. she does not have a history of rheumatic fever. she does not have a history of alcohol use. The patient does not have a history of early familial atrial fibrillation or other arrhythmias.  she has a BMI of Body mass  index is 21.44 kg/m.. Filed Weights   06/30/22 1052  Weight: 49.8 kg    Family History  Problem Relation Age of Onset   Hypertension Mother    Colon cancer Neg Hx    Breast cancer Neg Hx      Atrial Fibrillation Management history:  Previous antiarrhythmic drugs: None Previous cardioversions: None Previous ablations: None Anticoagulation history: Eliquis 2.5 mg BID   Past Medical History:  Diagnosis Date   Breast cancer (HCC)    Diverticulitis    Hypertension    Uterine cancer (HCC)    Past Surgical History:  Procedure Laterality Date   ABDOMINAL HYSTERECTOMY     BREAST LUMPECTOMY     EYE SURGERY      Current Outpatient Medications  Medication Sig Dispense Refill   ACIDOPHILUS LACTOBACILLUS PO Take 1 capsule by mouth daily.     betamethasone dipropionate 0.05 % lotion Apply 1 to 2 times a day under fingernail as needed for detachment     celecoxib (CELEBREX) 200 MG capsule Take 200 mg by mouth daily with breakfast.     Cholecalciferol (VITAMIN D3) 25 MCG (1000 UT) CAPS Take 1,000 Units by mouth daily.     citalopram (CELEXA) 10 MG tablet Take 10 mg by mouth daily.     guaiFENesin (MUCINEX) 600 MG 12 hr tablet Take 1 tablet by mouth as needed.     lisinopril (PRINIVIL,ZESTRIL) 10 MG tablet Take 10 mg by mouth daily.     methylcellulose (CITRUCEL) oral powder Take by mouth daily.     Multiple Vitamin (MULTIVITAMIN) capsule Take 1 capsule by mouth daily.       polyethylene glycol (MIRALAX / GLYCOLAX) 17 g packet Take 17 g by mouth every other day.     triamcinolone (NASACORT) 55 MCG/ACT AERO nasal inhaler TWO SPRAYS INTO EACH NOSTRIL DAILY     apixaban (ELIQUIS) 2.5 MG TABS tablet Take 1 tablet (2.5 mg total) by mouth 2 (two) times daily. 60 tablet 3   metoprolol succinate (TOPROL-XL) 25 MG 24 hr tablet Take 0.5 tablets (12.5 mg total) by mouth daily. 15 tablet 3   No current facility-administered medications for this encounter.    Allergies  Allergen Reactions    Other Swelling and Other (See Comments)    "eye drops" (PVA)    Social History   Socioeconomic History   Marital status: Single    Spouse name: Not on file   Number of children: Not on file   Years of education: Not on file   Highest education level: Not on file  Occupational History   Not on file  Tobacco Use   Smoking status: Never   Smokeless tobacco: Never  Vaping Use   Vaping Use: Never used  Substance and Sexual Activity   Alcohol use: Yes    Comment: occ   Drug use: Never   Sexual activity: Not on file  Other Topics Concern   Not on file  Social History Narrative   Not on file   Social Determinants of Health   Financial Resource Strain: Not on file  Food Insecurity: Not on file  Transportation Needs: Not on file  Physical Activity: Not on file  Stress: Not on file  Social Connections: Not on file  Intimate Partner Violence: Not on file   ROS- All systems are reviewed and negative except as per the HPI above.  Physical Exam: Vitals:   06/30/22 1052  BP: (!) 152/82  Pulse: 72  Weight: 49.8 kg  Height: 5' (1.524 m)    GEN- The patient is a well appearing female, alert and oriented x 3 today.   Head- normocephalic, atraumatic Eyes-  Sclera clear, conjunctiva pink Ears- hearing intact Oropharynx- clear Neck- supple  Lungs- Clear to ausculation bilaterally, normal work of breathing Heart- Irregular rate and rhythm, no murmurs, rubs or gallops  GI- soft, NT, ND, + BS Extremities- no clubbing, cyanosis, or edema MS- no significant deformity or atrophy Skin- no rash or lesion Psych- euthymic mood, full affect Neuro- strength and sensation are intact  Wt Readings from Last 3 Encounters:  06/30/22 49.8 kg  06/07/22 50.8 kg  04/21/18 50.8 kg    EKG today demonstrates  Vent. rate 72 BPM PR interval * ms QRS duration 82 ms QT/QTcB 432/473 ms P-R-T axes 94 94 90  Suspect arm lead reversal, interpretation assumes no reversal Atrial flutter with  variable A-V block Rightward axis Biventricular hypertrophy Nonspecific ST and T wave abnormality Prolonged QT Abnormal ECG When compared with ECG of 07-Jun-2022 21:25, PREVIOUS ECG IS PRESENT  Echo N/A  Epic records are reviewed at length today.  CHA2DS2-VASc Score = 4  The patient's score is based upon: CHF History: 0 HTN History: 1 Diabetes History: 0 Stroke History: 0 Vascular Disease History: 0 Age Score: 2 Gender Score: 1       ASSESSMENT AND PLAN: Persistent atrial flutter The patient's CHA2DS2-VASc score is 4, indicating a 4.8% annual risk of stroke.    She is currently in rate controlled atrial flutter.   Education provided about atrial flutter with visual diagram. Discussion about medication treatments and ablation in detail. Confirmed with EP   her two prior ECGs are atrial flutter as well. If DCCV is unsuccessful in maintaining NSR, can consider EP referral to discuss atrial flutter ablation. After discussion, we will proceed with scheduling DCCV at this time.  - Schedule DCCV. - Obtain baseline echo. - F/u 1-2 weeks after DCCV.  - Continue current medications without change.   Secondary Hypercoagulable State (ICD10:  D68.69) The patient is at significant risk for stroke/thromboembolism based upon her CHA2DS2-VASc Score of 4.  Continue Apixaban (Eliquis). She meets criteria for Eliquis 2.5 mg BID dosage. Education on compliance with anticoagulation provided. I did advise she discontinue Celebrex due to interaction with Eliquis.   4. Snoring with concern for Obstructive sleep apnea We will defer at this time; she does not appear to have symptoms concerning for OSA.    Follow up 1-2 weeks after DCCV.    Dvontae Ruan, PA-C [facility] [address] [place], [place] [address] [phone] 06/30/2022 11:57 AM  

## 2022-06-30 NOTE — Patient Instructions (Addendum)
Cardioversion scheduled for: Friday, May 24th   - Arrive at the [facility] and go to admitting at 11am   - Do not eat or drink anything after midnight the night prior to your procedure.   - Take all your morning medication (except diabetic medications) with a sip of water prior to arrival.  - You will not be able to drive home after your procedure.    - Do NOT miss any doses of your blood thinner - if you should miss a dose please notify our office immediately.   - If you feel as if you go back into normal rhythm prior to scheduled cardioversion, please notify our office immediately.   If your procedure is canceled in the cardioversion suite you will be charged a cancellation fee.

## 2022-07-08 NOTE — Pre-Procedure Instructions (Signed)
Spoke to patient on phone regarding cardioversion - arrive at 1100, NPO after midnight. Confirmed patient has ride home and responsible person to stay with her for 24 hours after procedure. Confirmed no missed doses of Xarelto, instructed to take pills in the AM with a sip of water

## 2022-07-09 ENCOUNTER — Encounter (HOSPITAL_COMMUNITY): Payer: Self-pay | Admitting: Cardiology

## 2022-07-09 ENCOUNTER — Other Ambulatory Visit: Payer: Self-pay

## 2022-07-09 ENCOUNTER — Ambulatory Visit (HOSPITAL_COMMUNITY)
Admission: RE | Admit: 2022-07-09 | Discharge: 2022-07-09 | Disposition: A | Discharge status: Home / Self Care | Payer: Medicare Other | Attending: Cardiology | Admitting: Cardiology

## 2022-07-09 ENCOUNTER — Ambulatory Visit (HOSPITAL_COMMUNITY): Payer: Medicare Other | Admitting: Anesthesiology

## 2022-07-09 ENCOUNTER — Encounter (HOSPITAL_COMMUNITY)
Admission: RE | Disposition: A | Discharge status: Home / Self Care | Payer: Self-pay | Source: Home / Self Care | Attending: Cardiology

## 2022-07-09 ENCOUNTER — Ambulatory Visit (HOSPITAL_BASED_OUTPATIENT_CLINIC_OR_DEPARTMENT_OTHER): Payer: Medicare Other | Admitting: Anesthesiology

## 2022-07-09 DIAGNOSIS — Z8542 Personal history of malignant neoplasm of other parts of uterus: Secondary | ICD-10-CM | POA: Diagnosis not present

## 2022-07-09 DIAGNOSIS — I4891 Unspecified atrial fibrillation: Secondary | ICD-10-CM | POA: Diagnosis present

## 2022-07-09 DIAGNOSIS — Z853 Personal history of malignant neoplasm of breast: Secondary | ICD-10-CM | POA: Diagnosis not present

## 2022-07-09 DIAGNOSIS — D6869 Other thrombophilia: Secondary | ICD-10-CM | POA: Diagnosis not present

## 2022-07-09 DIAGNOSIS — Z791 Long term (current) use of non-steroidal anti-inflammatories (NSAID): Secondary | ICD-10-CM | POA: Diagnosis not present

## 2022-07-09 DIAGNOSIS — I4892 Unspecified atrial flutter: Secondary | ICD-10-CM | POA: Diagnosis not present

## 2022-07-09 DIAGNOSIS — I491 Atrial premature depolarization: Secondary | ICD-10-CM | POA: Insufficient documentation

## 2022-07-09 DIAGNOSIS — I10 Essential (primary) hypertension: Secondary | ICD-10-CM | POA: Diagnosis not present

## 2022-07-09 DIAGNOSIS — I119 Hypertensive heart disease without heart failure: Secondary | ICD-10-CM | POA: Diagnosis not present

## 2022-07-09 DIAGNOSIS — Z79899 Other long term (current) drug therapy: Secondary | ICD-10-CM | POA: Diagnosis not present

## 2022-07-09 DIAGNOSIS — Z7901 Long term (current) use of anticoagulants: Secondary | ICD-10-CM | POA: Insufficient documentation

## 2022-07-09 HISTORY — PX: CARDIOVERSION: SHX1299

## 2022-07-09 LAB — POCT I-STAT, CHEM 8
BUN: 19 mg/dL (ref 8–23)
Calcium, Ion: 1.29 mmol/L (ref 1.15–1.40)
Chloride: 99 mmol/L (ref 98–111)
Creatinine, Ser: 0.9 mg/dL (ref 0.44–1.00)
Glucose, Bld: 92 mg/dL (ref 70–99)
HCT: 39 % (ref 36.0–46.0)
Hemoglobin: 13.3 g/dL (ref 12.0–15.0)
Potassium: 5.1 mmol/L (ref 3.5–5.1)
Sodium: 133 mmol/L — ABNORMAL LOW (ref 135–145)
TCO2: 27 mmol/L (ref 22–32)

## 2022-07-09 SURGERY — CARDIOVERSION
Anesthesia: General

## 2022-07-09 MED ORDER — SODIUM CHLORIDE 0.9 % IV SOLN: INTRAVENOUS | Status: DC

## 2022-07-09 MED ORDER — LIDOCAINE 2% (20 MG/ML) 5 ML SYRINGE
INTRAMUSCULAR | Status: DC | PRN
  Administered 2022-07-09: 40 mg via INTRAVENOUS

## 2022-07-09 MED ORDER — PROPOFOL 10 MG/ML IV BOLUS
INTRAVENOUS | Status: DC | PRN
  Administered 2022-07-09: 20 mg via INTRAVENOUS
  Administered 2022-07-09: 40 mg via INTRAVENOUS

## 2022-07-09 SURGICAL SUPPLY — 1 items: ELECT DEFIB PAD ADLT CADENCE (PAD) ×1 IMPLANT

## 2022-07-09 NOTE — Anesthesia Preprocedure Evaluation (Addendum)
Anesthesia Evaluation  Patient identified by MRN, date of birth, ID band Patient awake    Reviewed: Allergy & Precautions, NPO status , Patient's Chart, lab work & pertinent test results  History of Anesthesia Complications Negative for: history of anesthetic complications  Airway Mallampati: II  TM Distance: >3 FB Neck ROM: Full    Dental  (+) Dental Advisory Given   Pulmonary neg pulmonary ROS   Pulmonary exam normal breath sounds clear to auscultation       Cardiovascular hypertension (lisinopril, metoprolol), Pt. on medications and Pt. on home beta blockers (-) angina (-) Past MI, (-) Cardiac Stents and (-) CABG + dysrhythmias Atrial Fibrillation  Rhythm:Regular Rate:Normal     Neuro/Psych negative neurological ROS     GI/Hepatic negative GI ROS, Neg liver ROS,,,  Endo/Other  negative endocrine ROS    Renal/GU negative Renal ROS     Musculoskeletal   Abdominal   Peds  Hematology negative hematology ROS (+)   Anesthesia Other Findings H/o breast cancer, h/o uterine cancer  Reproductive/Obstetrics                             Anesthesia Physical Anesthesia Plan  ASA: 3  Anesthesia Plan: General   Post-op Pain Management: Minimal or no pain anticipated   Induction: Intravenous  PONV Risk Score and Plan: 3 and Treatment may vary due to age or medical condition  Airway Management Planned: Natural Airway and Mask  Additional Equipment:   Intra-op Plan:   Post-operative Plan:   Informed Consent: I have reviewed the patients History and Physical, chart, labs and discussed the procedure including the risks, benefits and alternatives for the proposed anesthesia with the patient or authorized representative who has indicated his/her understanding and acceptance.     Dental advisory given  Plan Discussed with: CRNA and Anesthesiologist  Anesthesia Plan Comments: (Risks of  general anesthesia discussed including, but not limited to, sore throat, hoarse voice, chipped/damaged teeth, injury to vocal cords, nausea and vomiting, allergic reactions, lung infection, heart attack, stroke, and death. All questions answered. )        Anesthesia Quick Evaluation

## 2022-07-09 NOTE — Interval H&P Note (Signed)
History and Physical Interval Note:  07/09/2022 11:32 AM  Chelsea Petersen  has presented today for surgery, with the diagnosis of AFIB.  The various methods of treatment have been discussed with the patient and family. After consideration of risks, benefits and other options for treatment, the patient has consented to  Procedure(s): CARDIOVERSION (N/A) as a surgical intervention.  The patient's history has been reviewed, patient examined, no change in status, stable for surgery.  I have reviewed the patient's chart and labs.  Questions were answered to the patient's satisfaction.     [name]

## 2022-07-09 NOTE — CV Procedure (Signed)
Procedure:   DCCV  Indication:  Symptomatic atrial fibrillation/flutter  Procedure Note:  The patient signed informed consent.  They have had had therapeutic anticoagulation with apixaban greater than 3 weeks.  Anesthesia was administered by Dr. Isaias Cowman.  Patient received 40 mg IV lidocaine and 60 mg IV propofol.Adequate airway was maintained throughout and vital followed per protocol.  They were cardioverted x 1 with 120J of biphasic synchronized energy.  They converted to NSR.  There were no apparent complications.  The patient had normal neuro status and respiratory status post procedure with vitals stable as recorded elsewhere.    Follow up:  They will continue on current medical therapy and follow up with cardiology as scheduled.  Jodelle Red, MD PhD 07/09/2022 12:40 PM

## 2022-07-09 NOTE — Transfer of Care (Signed)
Immediate Anesthesia Transfer of Care Note  Patient: Chelsea Petersen  Procedure(s) Performed: CARDIOVERSION  Patient Location: Cath Lab  Anesthesia Type:General  Level of Consciousness: sedated  Airway & Oxygen Therapy: Patient Spontanous Breathing  Post-op Assessment: Report given to RN and Post -op Vital signs reviewed and stable  Post vital signs: Reviewed and stable  Last Vitals:  Vitals Value Taken Time  BP 172/112 07/09/22 1230  Temp    Pulse 105 07/09/22 1232  Resp 18 07/09/22 1232  SpO2 97 % 07/09/22 1232  Vitals shown include unvalidated device data.  Last Pain:  Vitals:   07/09/22 1106  TempSrc: Temporal  PainSc: 0-No pain         Complications: No notable events documented.

## 2022-07-09 NOTE — Anesthesia Postprocedure Evaluation (Signed)
Anesthesia Post Note  Patient: Chelsea Petersen  Procedure(s) Performed: CARDIOVERSION     Patient location during evaluation: PACU Anesthesia Type: General Level of consciousness: awake Pain management: pain level controlled Vital Signs Assessment: post-procedure vital signs reviewed and stable Respiratory status: spontaneous breathing, nonlabored ventilation and respiratory function stable Cardiovascular status: blood pressure returned to baseline and stable Postop Assessment: no apparent nausea or vomiting Anesthetic complications: no   No notable events documented.  Last Vitals:  Vitals:   07/09/22 1255 07/09/22 1300  BP: (!) 161/64 (!) 158/76  Pulse: 64 64  Resp: 17 (!) 22  Temp:    SpO2: 96% 97%    Last Pain:  Vitals:   07/09/22 1245  TempSrc: Temporal  PainSc: 0-No pain                 Linton Rump

## 2022-07-13 ENCOUNTER — Encounter (HOSPITAL_COMMUNITY): Payer: Self-pay | Admitting: Cardiology

## 2022-07-14 ENCOUNTER — Ambulatory Visit (HOSPITAL_COMMUNITY): Payer: Medicare Other | Admitting: Internal Medicine

## 2022-07-21 ENCOUNTER — Other Ambulatory Visit (HOSPITAL_COMMUNITY): Payer: Medicare Other

## 2022-07-26 ENCOUNTER — Ambulatory Visit (HOSPITAL_COMMUNITY): Payer: Medicare Other | Admitting: Internal Medicine

## 2022-07-28 ENCOUNTER — Encounter (HOSPITAL_COMMUNITY): Payer: Self-pay | Admitting: Internal Medicine

## 2022-07-28 ENCOUNTER — Ambulatory Visit (HOSPITAL_BASED_OUTPATIENT_CLINIC_OR_DEPARTMENT_OTHER)
Admission: RE | Admit: 2022-07-28 | Discharge: 2022-07-28 | Disposition: A | Discharge status: Home / Self Care | Payer: Medicare Other | Source: Ambulatory Visit | Attending: Internal Medicine | Admitting: Internal Medicine

## 2022-07-28 ENCOUNTER — Ambulatory Visit (HOSPITAL_COMMUNITY)
Admission: RE | Admit: 2022-07-28 | Discharge: 2022-07-28 | Disposition: A | Discharge status: Home / Self Care | Payer: Medicare Other | Source: Ambulatory Visit | Attending: Internal Medicine | Admitting: Internal Medicine

## 2022-07-28 VITALS — BP 114/68 | HR 65 | Ht 60.0 in | Wt 107.4 lb

## 2022-07-28 DIAGNOSIS — Z79899 Other long term (current) drug therapy: Secondary | ICD-10-CM | POA: Diagnosis not present

## 2022-07-28 DIAGNOSIS — Z90710 Acquired absence of both cervix and uterus: Secondary | ICD-10-CM | POA: Insufficient documentation

## 2022-07-28 DIAGNOSIS — I10 Essential (primary) hypertension: Secondary | ICD-10-CM | POA: Insufficient documentation

## 2022-07-28 DIAGNOSIS — R0683 Snoring: Secondary | ICD-10-CM | POA: Diagnosis not present

## 2022-07-28 DIAGNOSIS — I4891 Unspecified atrial fibrillation: Secondary | ICD-10-CM | POA: Insufficient documentation

## 2022-07-28 DIAGNOSIS — Z853 Personal history of malignant neoplasm of breast: Secondary | ICD-10-CM | POA: Diagnosis not present

## 2022-07-28 DIAGNOSIS — Z7901 Long term (current) use of anticoagulants: Secondary | ICD-10-CM | POA: Insufficient documentation

## 2022-07-28 DIAGNOSIS — Z8542 Personal history of malignant neoplasm of other parts of uterus: Secondary | ICD-10-CM | POA: Diagnosis not present

## 2022-07-28 DIAGNOSIS — D6869 Other thrombophilia: Secondary | ICD-10-CM

## 2022-07-28 DIAGNOSIS — I484 Atypical atrial flutter: Secondary | ICD-10-CM | POA: Diagnosis not present

## 2022-07-28 DIAGNOSIS — I483 Typical atrial flutter: Secondary | ICD-10-CM

## 2022-07-28 DIAGNOSIS — K5792 Diverticulitis of intestine, part unspecified, without perforation or abscess without bleeding: Secondary | ICD-10-CM | POA: Diagnosis not present

## 2022-07-28 LAB — ECHOCARDIOGRAM COMPLETE
AR max vel: 1.34 cm2
AV Area VTI: 1.39 cm2
AV Area mean vel: 1.45 cm2
AV Mean grad: 2.5 mmHg
AV Peak grad: 4.5 mmHg
AV Vena cont: 0.5 cm
Ao pk vel: 1.06 m/s
Area-P 1/2: 4.17 cm2
Calc EF: 61.8 %
MV M vel: 5.15 m/s
MV Peak grad: 106.1 mmHg
MV Vena cont: 0.25 cm
P 1/2 time: 424 msec
Radius: 0.37 cm
S' Lateral: 2.5 cm
Single Plane A2C EF: 62.1 %
Single Plane A4C EF: 56.2 %

## 2022-07-28 NOTE — Progress Notes (Signed)
Primary Care Physician: Felipa Furnace, NP Primary Cardiologist: None Primary Electrophysiologist: None Referring Physician: [facility]   Chelsea Petersen is a 82 y.o. female with a history of HTN, remote history of breast cancer and uterine cancer s/p hysterectomy, diverticulitis, SBO, and atrial fibrillation who presents for consultation in the [facility]. The patient was initially diagnosed with atrial fibrillation on ED visit 06/07/22 when she was evaluated due to palpitations for a couple of weeks. She notes irregular heartbeat notifications on her fitness watch. She was in Afib with RVR in ED (low 100s without rate control medication). Discharged on Toprol 12.5 mg qhs and Eliquis 2.5 mg BID. Patient is on Eliquis 2.5 mg BID for a CHADS2VASC score of 4. She is eligible to take lower dose of Eliquis due to age and weight.   On evaluation today, she is currently in rate controlled atrial flutter. She does notice shortness of breath with exertion. Her Fitbit watch will provide HR alerts but not a rhythm strip.   She does not drink alcohol or excessive amounts of caffeine. She snores some but no stop breathing; feels well rested after she wakes up.   She is compliant with anticoagulation and has not missed any doses. She has no bleeding concerns.  On follow up 07/28/22, she is currently in NSR. She is s/p successful DCCV on 07/09/22 with conversion to NSR x 1 shock of 120J. She feels well overall since then. No missed doses of Eliquis 2.5 mg BID. No episodes of arrhythmia since DCCV.   Today, she denies symptoms of palpitations, chest pain, shortness of breath, orthopnea, PND, lower extremity edema, dizziness, presyncope, syncope, snoring, daytime somnolence, bleeding, or neurologic sequela. The patient is tolerating medications without difficulties and is otherwise without complaint today.   Atrial Fibrillation Risk Factors:  she does not have symptoms or  diagnosis of sleep apnea. she does not have a history of rheumatic fever. she does not have a history of alcohol use. The patient does not have a history of early familial atrial fibrillation or other arrhythmias.  she has a BMI of Body mass index is 20.98 kg/m.Marland Kitchen Filed Weights   07/28/22 1111  Weight: 48.7 kg     Family History  Problem Relation Age of Onset   Hypertension Mother    Colon cancer Neg Hx    Breast cancer Neg Hx     Atrial Fibrillation Management history:  Previous antiarrhythmic drugs: None Previous cardioversions: None Previous ablations: None Anticoagulation history: Eliquis 2.5 mg BID   Past Medical History:  Diagnosis Date   Breast cancer (HCC)    Diverticulitis    Hypertension    Uterine cancer (HCC)    Past Surgical History:  Procedure Laterality Date   ABDOMINAL HYSTERECTOMY     BREAST LUMPECTOMY     CARDIOVERSION N/A 07/09/2022   Procedure: CARDIOVERSION;  Surgeon: Jodelle Red, MD;  Location: [facility];  Service: Cardiovascular;  Laterality: N/A;   EYE SURGERY      Current Outpatient Medications  Medication Sig Dispense Refill   acetaminophen (TYLENOL) 500 MG tablet Take 1,000 mg by mouth every 6 (six) hours as needed for moderate pain.     ACIDOPHILUS LACTOBACILLUS PO Take 1 capsule by mouth daily.     apixaban (ELIQUIS) 2.5 MG TABS tablet Take 1 tablet (2.5 mg total) by mouth 2 (two) times daily. 60 tablet 3   Cholecalciferol (VITAMIN D3) 25 MCG (1000 UT) CAPS Take 1,000  Units by mouth daily.     citalopram (CELEXA) 10 MG tablet Take 10 mg by mouth daily.     guaiFENesin (MUCINEX) 600 MG 12 hr tablet Take 600 mg by mouth 2 (two) times daily as needed for cough.     lisinopril (PRINIVIL,ZESTRIL) 10 MG tablet Take 10 mg by mouth daily.     magnesium gluconate (MAGONATE) 500 MG tablet Take 500 mg by mouth daily.     methylcellulose (CITRUCEL) oral powder Take 1 packet by mouth daily.     metoprolol succinate (TOPROL-XL)  25 MG 24 hr tablet Take 0.5 tablets (12.5 mg total) by mouth daily. 15 tablet 3   Multiple Vitamin (MULTIVITAMIN) capsule Take 1 capsule by mouth daily.     Polyethyl Glycol-Propyl Glycol (LUBRICATING EYE DROPS OP) Place 1 drop into both eyes daily as needed (dry eyes).     polyethylene glycol (MIRALAX / GLYCOLAX) 17 g packet Take 17 g by mouth every other day.     triamcinolone (NASACORT) 55 MCG/ACT AERO nasal inhaler Place 2 sprays into the nose daily.     betamethasone dipropionate 0.05 % lotion Apply 1 to 2 times a day under fingernail as needed for detachment (Patient not taking: Reported on 07/28/2022)     No current facility-administered medications for this encounter.    Allergies  Allergen Reactions   Other Swelling and Other (See Comments)    "eye drops" (PVA)    Social History   Socioeconomic History   Marital status: Single    Spouse name: Not on file   Number of children: Not on file   Years of education: Not on file   Highest education level: Not on file  Occupational History   Not on file  Tobacco Use   Smoking status: Never   Smokeless tobacco: Never  Vaping Use   Vaping Use: Never used  Substance and Sexual Activity   Alcohol use: Yes    Comment: occ   Drug use: Never   Sexual activity: Not on file  Other Topics Concern   Not on file  Social History Narrative   Not on file   Social Determinants of Health   Financial Resource Strain: Not on file  Food Insecurity: Not on file  Transportation Needs: Not on file  Physical Activity: Not on file  Stress: Not on file  Social Connections: Not on file  Intimate Partner Violence: Not on file   ROS- All systems are reviewed and negative except as per the HPI above.  Physical Exam: Vitals:   07/28/22 1111  BP: 114/68  Pulse: 65  Weight: 48.7 kg  Height: 5' (1.524 m)    GEN- The patient is well appearing, alert and oriented x 3 today.   Head- normocephalic, atraumatic Eyes-  Sclera clear, conjunctiva  pink Ears- hearing intact Lungs- Clear to ausculation bilaterally, normal work of breathing Heart- Regular rate and rhythm, no murmurs, rubs or gallops, PMI not laterally displaced Extremities- no clubbing, cyanosis, or edema MS- no significant deformity or atrophy Skin- no rash or lesion Psych- euthymic mood, full affect Neuro- strength and sensation are intact   Wt Readings from Last 3 Encounters:  07/28/22 48.7 kg  07/09/22 49 kg  06/30/22 49.8 kg    EKG today demonstrates  NSR  LVH HR 65 PR 184 ms QRS 82 ms QT/Qtc 390/405 ms  Echo performed earlier this morning.  Epic records are reviewed at length today.  CHA2DS2-VASc Score = 4  The patient's score is  based upon: CHF History: 0 HTN History: 1 Diabetes History: 0 Stroke History: 0 Vascular Disease History: 0 Age Score: 2 Gender Score: 1      ASSESSMENT AND PLAN: Persistent atrial flutter The patient's CHA2DS2-VASc score is 4, indicating a 4.8% annual risk of stroke.    She is currently in NSR s/p successful DCCV on 07/09/22.  Continue conservative observation with Kardiamobile device. Going forward, if she has more episodes of flutter would probably consider ablation. Would place monitor to rule out Afib.   - Continue current medications without change. - F/u 3 months.  Secondary Hypercoagulable State (ICD10:  D68.69) The patient is at significant risk for stroke/thromboembolism based upon her CHA2DS2-VASc Score of 4.  Continue Apixaban (Eliquis). She meets criteria for Eliquis 2.5 mg BID dosage. Education on compliance with anticoagulation provided. I did advise she discontinue Celebrex due to interaction with Eliquis.   4. Snoring with concern for Obstructive sleep apnea We will defer at this time; she does not appear to have symptoms concerning for OSA.    Follow up 3 months Afib clinic.   Lake Bells, PA-C Af[facility] Clinic [facility] [address] [place], [place]  [address] [phone] 07/28/2022 11:40 AM

## 2022-08-11 ENCOUNTER — Telehealth (HOSPITAL_COMMUNITY): Payer: Self-pay | Admitting: *Deleted

## 2022-08-11 MED ORDER — METOPROLOL SUCCINATE ER 25 MG PO TB24: 25.0000 mg | ORAL_TABLET | Freq: Every day | ORAL | 3 refills | Status: DC

## 2022-08-11 NOTE — Telephone Encounter (Signed)
Patient son called in stating her kardia device confirms back in afib. Pt feels fatigued but otherwise without symptoms. Pt is out of town until middle of July. BP 150/72 HR 89-92. Discussed with Landry Mellow PA will increase metoprolol to 25mg  daily and follow up once returned. Appt made. Son verbalized understanding.

## 2022-09-02 ENCOUNTER — Encounter (HOSPITAL_COMMUNITY): Payer: Self-pay | Admitting: Internal Medicine

## 2022-09-02 ENCOUNTER — Inpatient Hospital Stay (HOSPITAL_COMMUNITY)
Admission: RE | Admit: 2022-09-02 | Discharge: 2022-09-02 | Disposition: A | Discharge status: Home / Self Care | Payer: Medicare Other | Source: Ambulatory Visit | Attending: Internal Medicine | Admitting: Internal Medicine

## 2022-09-02 ENCOUNTER — Ambulatory Visit (HOSPITAL_COMMUNITY)
Admission: RE | Admit: 2022-09-02 | Discharge: 2022-09-02 | Disposition: A | Discharge status: Home / Self Care | Payer: Medicare Other | Source: Ambulatory Visit | Attending: Internal Medicine | Admitting: Internal Medicine

## 2022-09-02 VITALS — BP 136/82 | HR 82 | Ht 60.0 in | Wt 109.6 lb

## 2022-09-02 DIAGNOSIS — I080 Rheumatic disorders of both mitral and aortic valves: Secondary | ICD-10-CM | POA: Diagnosis not present

## 2022-09-02 DIAGNOSIS — I483 Typical atrial flutter: Secondary | ICD-10-CM | POA: Diagnosis not present

## 2022-09-02 DIAGNOSIS — I4891 Unspecified atrial fibrillation: Secondary | ICD-10-CM | POA: Diagnosis not present

## 2022-09-02 DIAGNOSIS — Z853 Personal history of malignant neoplasm of breast: Secondary | ICD-10-CM | POA: Diagnosis not present

## 2022-09-02 DIAGNOSIS — I10 Essential (primary) hypertension: Secondary | ICD-10-CM | POA: Insufficient documentation

## 2022-09-02 DIAGNOSIS — Z7901 Long term (current) use of anticoagulants: Secondary | ICD-10-CM | POA: Diagnosis not present

## 2022-09-02 DIAGNOSIS — R0683 Snoring: Secondary | ICD-10-CM | POA: Diagnosis not present

## 2022-09-02 DIAGNOSIS — Z8249 Family history of ischemic heart disease and other diseases of the circulatory system: Secondary | ICD-10-CM | POA: Insufficient documentation

## 2022-09-02 DIAGNOSIS — D6869 Other thrombophilia: Secondary | ICD-10-CM | POA: Insufficient documentation

## 2022-09-02 DIAGNOSIS — Z8542 Personal history of malignant neoplasm of other parts of uterus: Secondary | ICD-10-CM | POA: Diagnosis not present

## 2022-09-02 MED ORDER — AMIODARONE HCL 200 MG PO TABS: ORAL_TABLET | ORAL | 0 refills | Status: DC

## 2022-09-02 NOTE — Patient Instructions (Addendum)
On August 2nd - Start Amiodarone 200mg  twice a day for 14 days then reduce to once a day take with food

## 2022-09-02 NOTE — Progress Notes (Signed)
Primary Care Physician: Felipa Furnace, NP Primary Cardiologist: None Primary Electrophysiologist: None Referring Physician: [facility]   Chelsea Petersen is a 82 y.o. female with a history of HTN, remote history of breast cancer and uterine cancer s/p hysterectomy, diverticulitis, SBO, and atrial fibrillation who presents for consultation in the [facility]. The patient was initially diagnosed with atrial fibrillation on ED visit 06/07/22 when she was evaluated due to palpitations for a couple of weeks. She notes irregular heartbeat notifications on her fitness watch. She was in Afib with RVR in ED (low 100s without rate control medication). Discharged on Toprol 12.5 mg qhs and Eliquis 2.5 mg BID. Patient is on Eliquis 2.5 mg BID for a CHADS2VASC score of 4. She is eligible to take lower dose of Eliquis due to age and weight.   On evaluation today, she is currently in rate controlled atrial flutter. She does notice shortness of breath with exertion. Her Fitbit watch will provide HR alerts but not a rhythm strip.   She does not drink alcohol or excessive amounts of caffeine. She snores some but no stop breathing; feels well rested after she wakes up.   She is compliant with anticoagulation and has not missed any doses. She has no bleeding concerns.  On follow up 07/28/22, she is currently in NSR. She is s/p successful DCCV on 07/09/22 with conversion to NSR x 1 shock of 120J. She feels well overall since then. No missed doses of Eliquis 2.5 mg BID. No episodes of arrhythmia since DCCV.   On follow up 09/02/22, she is currently in rate controlled atrial flutter. Patient's son called on 6/26 noting patient was back in Afib via Kardim[employer] device. They were out of town. She feels tired and SOB when out of rhythm. No missed doses of Eliquis 2.5 mg BID. Daughter of patient notes she has been going in and out of normal rhythm.  Today, she denies symptoms of palpitations,  chest pain, shortness of breath, orthopnea, PND, lower extremity edema, dizziness, presyncope, syncope, snoring, daytime somnolence, bleeding, or neurologic sequela. The patient is tolerating medications without difficulties and is otherwise without complaint today.   Atrial Fibrillation Risk Factors:  she does not have symptoms or diagnosis of sleep apnea. she does not have a history of rheumatic fever. she does not have a history of alcohol use. The patient does not have a history of early familial atrial fibrillation or other arrhythmias.  she has a BMI of Body mass index is 21.4 kg/m.Marland Kitchen Filed Weights   09/02/22 1023  Weight: 49.7 kg    Family History  Problem Relation Age of Onset   Hypertension Mother    Colon cancer Neg Hx    Breast cancer Neg Hx     Atrial Fibrillation Management history:  Previous antiarrhythmic drugs: None Previous cardioversions: 07/09/22 Previous ablations: None Anticoagulation history: Eliquis 2.5 mg BID   Past Medical History:  Diagnosis Date   Breast cancer (HCC)    Diverticulitis    Hypertension    Uterine cancer (HCC)    Past Surgical History:  Procedure Laterality Date   ABDOMINAL HYSTERECTOMY     BREAST LUMPECTOMY     CARDIOVERSION N/A 07/09/2022   Procedure: CARDIOVERSION;  Surgeon: Jodelle Red, MD;  Location: [facility] INVASIVE CV LAB;  Service: Cardiovascular;  Laterality: N/A;   EYE SURGERY      Current Outpatient Medications  Medication Sig Dispense Refill   acetaminophen (TYLENOL) 500 MG tablet Take 1,000  mg by mouth every 6 (six) hours as needed for moderate pain.     ACIDOPHILUS LACTOBACILLUS PO Take 1 capsule by mouth daily.     amiodarone (PACERONE) 200 MG tablet Take 1 tablet by mouth twice a day for 14 days then daily 45 tablet 0   apixaban (ELIQUIS) 2.5 MG TABS tablet Take 1 tablet (2.5 mg total) by mouth 2 (two) times daily. 60 tablet 3   betamethasone dipropionate 0.05 % lotion as needed.     Cholecalciferol  (VITAMIN D3) 25 MCG (1000 UT) CAPS Take 1,000 Units by mouth daily.     citalopram (CELEXA) 10 MG tablet Take 10 mg by mouth daily.     guaiFENesin (MUCINEX) 600 MG 12 hr tablet Take 600 mg by mouth 2 (two) times daily as needed for cough.     lisinopril (PRINIVIL,ZESTRIL) 10 MG tablet Take 10 mg by mouth daily.     magnesium gluconate (MAGONATE) 500 MG tablet Take 500 mg by mouth daily.     methylcellulose (CITRUCEL) oral powder Take 1 packet by mouth daily.     metoprolol succinate (TOPROL-XL) 25 MG 24 hr tablet Take 1 tablet (25 mg total) by mouth daily. 15 tablet 3   Multiple Vitamin (MULTIVITAMIN) capsule Take 1 capsule by mouth daily.     Polyethyl Glycol-Propyl Glycol (LUBRICATING EYE DROPS OP) Place 1 drop into both eyes daily as needed (dry eyes).     polyethylene glycol (MIRALAX / GLYCOLAX) 17 g packet Take 17 g by mouth every other day.     triamcinolone (NASACORT) 55 MCG/ACT AERO nasal inhaler Place 2 sprays into the nose as needed.     No current facility-administered medications for this encounter.    Allergies  Allergen Reactions   Other Swelling and Other (See Comments)    "eye drops" (PVA)    ROS- All systems are reviewed and negative except as per the HPI above.  Physical Exam: Vitals:   09/02/22 1023  BP: 136/82  Pulse: 82  Weight: 49.7 kg  Height: 5' (1.524 m)    GEN- The patient is well appearing, alert and oriented x 3 today.   Neck - no JVD or carotid bruit noted Lungs- Clear to ausculation bilaterally, normal work of breathing Heart- Irregular rate and rhythm, no murmurs, rubs or gallops, PMI not laterally displaced Extremities- no clubbing, cyanosis, or edema Skin - no rash or ecchymosis noted   Wt Readings from Last 3 Encounters:  09/02/22 49.7 kg  07/28/22 48.7 kg  07/09/22 49 kg    EKG today demonstrates  Vent. rate 82 BPM PR interval * ms QRS duration 80 ms QT/QTcB 374/436 ms P-R-T axes 97 99 99 Suspect arm lead reversal, interpretation  assumes no reversal Atrial flutter with variable A-V block Rightward axis Pulmonary disease pattern Biventricular hypertrophy Nonspecific T wave abnormality Abnormal ECG When compared with ECG of 09-Jul-2022 12:43, PREVIOUS ECG IS PRESENT  Echo 07/28/22:  1. Left ventricular ejection fraction, by estimation, is 60 to 65%. Left  ventricular ejection fraction by 3D volume is 62 %. The left ventricle has  normal function. The left ventricle has no regional wall motion  abnormalities. Left ventricular diastolic   parameters are consistent with Grade II diastolic dysfunction  (pseudonormalization). The average left ventricular global longitudinal  strain is -19.8 %. The global longitudinal strain is normal.   2. Right ventricular systolic function is normal. The right ventricular  size is normal. There is normal pulmonary artery systolic pressure. The  estimated right ventricular systolic pressure is 8.9 mmHg.   3. Left atrial size was mildly dilated.   4. The mitral valve is degenerative. Mild mitral valve regurgitation. No  evidence of mitral stenosis.   5. The aortic valve is normal in structure. Aortic valve regurgitation is  mild to moderate. No aortic stenosis is present. Aortic regurgitation PHT  measures 424 msec. Aortic valve area, by VTI measures 1.39 cm. Aortic  valve mean gradient measures 2.5  mmHg. Aortic valve Vmax measures 1.06 m/s.   6. The inferior vena cava is normal in size with greater than 50%  respiratory variability, suggesting right atrial pressure of 3 mmHg.   Epic records are reviewed at length today.  CHA2DS2-VASc Score = 4  The patient's score is based upon: CHF History: 0 HTN History: 1 Diabetes History: 0 Stroke History: 0 Vascular Disease History: 0 Age Score: 2 Gender Score: 1      ASSESSMENT AND PLAN: Persistent atrial flutter The patient's CHA2DS2-VASc score is 4, indicating a 4.8% annual risk of stroke.    She is currently in atrial  flutter. We discussed management of rhythm going forward including ablation and medication therapy. After discussion of medication options and procedure, patient would like to speak with EP regarding ablation. ECGs have only shown atrial flutter. I will place cardiac monitor for 2 weeks to determine if she also has atrial fibrillation. We will begin amiodarone 200 mg BID after monitor period for a load. She will transition to 200 mg daily after 2 week load. I will see her in 1 month to discuss monitor results and repeat ECG.  Secondary Hypercoagulable State (ICD10:  D68.69) The patient is at significant risk for stroke/thromboembolism based upon her CHA2DS2-VASc Score of 4.  Continue Apixaban (Eliquis). She meets criteria for Eliquis 2.5 mg BID dosage. Education on compliance with anticoagulation provided. I did advise she discontinue Celebrex due to interaction with Eliquis.   4. Snoring with concern for Obstructive sleep apnea We will defer at this time; she does not appear to have symptoms concerning for OSA.    Follow up 1 month. Will schedule to see EP to discuss ablation.    Lake Bells, PA-C Afib Clinic [facility] [address] [place], [place] [address] [phone] 09/02/2022 11:15 AM

## 2022-09-21 NOTE — Addendum Note (Signed)
Encounter addended by: Shona Simpson, RN on: 09/21/2022 10:54 AM  Actions taken: Imaging Exam ended

## 2022-10-01 ENCOUNTER — Ambulatory Visit (HOSPITAL_COMMUNITY)
Admission: RE | Admit: 2022-10-01 | Discharge: 2022-10-01 | Disposition: A | Discharge status: Home / Self Care | Payer: Medicare Other | Source: Ambulatory Visit | Attending: Internal Medicine | Admitting: Internal Medicine

## 2022-10-01 ENCOUNTER — Encounter (HOSPITAL_COMMUNITY): Payer: Self-pay | Admitting: Internal Medicine

## 2022-10-01 ENCOUNTER — Other Ambulatory Visit (HOSPITAL_COMMUNITY): Payer: Self-pay | Admitting: Internal Medicine

## 2022-10-01 VITALS — BP 122/78 | HR 83 | Ht 60.0 in | Wt 110.0 lb

## 2022-10-01 DIAGNOSIS — R0683 Snoring: Secondary | ICD-10-CM | POA: Diagnosis not present

## 2022-10-01 DIAGNOSIS — I4891 Unspecified atrial fibrillation: Secondary | ICD-10-CM

## 2022-10-01 DIAGNOSIS — I4892 Unspecified atrial flutter: Secondary | ICD-10-CM | POA: Diagnosis not present

## 2022-10-01 DIAGNOSIS — Z79899 Other long term (current) drug therapy: Secondary | ICD-10-CM | POA: Insufficient documentation

## 2022-10-01 DIAGNOSIS — D6869 Other thrombophilia: Secondary | ICD-10-CM | POA: Diagnosis not present

## 2022-10-01 DIAGNOSIS — I483 Typical atrial flutter: Secondary | ICD-10-CM

## 2022-10-01 NOTE — Patient Instructions (Addendum)
Cardioversion scheduled for: September 3rd   - Arrive at the [facility] and go to admitting at 10:15am   - Do not eat or drink anything after midnight the night prior to your procedure.   - Take all your morning medication (except diabetic medications) with a sip of water prior to arrival.  - You will not be able to drive home after your procedure.    - Do NOT miss any doses of your blood thinner - if you should miss a dose please notify our office immediately.   - If you feel as if you go back into normal rhythm prior to scheduled cardioversion, please notify our office immediately.   If your procedure is canceled in the cardioversion suite you will be charged a cancellation fee.

## 2022-10-01 NOTE — H&P (View-Only) (Signed)
 Primary Care Physician: Felipa Furnace, NP Primary Cardiologist: None Primary Electrophysiologist: None Referring Physician: [facility]   Chelsea Petersen is a 82 y.o. female with a history of HTN, remote history of breast cancer and uterine cancer s/p hysterectomy, diverticulitis, SBO, and atrial fibrillation who presents for consultation in the [facility]. The patient was initially diagnosed with atrial fibrillation on ED visit 06/07/22 when she was evaluated due to palpitations for a couple of weeks. She notes irregular heartbeat notifications on her fitness watch. She was in Afib with RVR in ED (low 100s without rate control medication). Discharged on Toprol 12.5 mg qhs and Eliquis 2.5 mg BID. Patient is on Eliquis 2.5 mg BID for a CHADS2VASC score of 4. She is eligible to take lower dose of Eliquis due to age and weight.   On evaluation today, she is currently in rate controlled atrial flutter. She does notice shortness of breath with exertion. Her Fitbit watch will provide HR alerts but not a rhythm strip.   She does not drink alcohol or excessive amounts of caffeine. She snores some but no stop breathing; feels well rested after she wakes up.   She is compliant with anticoagulation and has not missed any doses. She has no bleeding concerns.  On follow up 07/28/22, she is currently in NSR. She is s/p successful DCCV on 07/09/22 with conversion to NSR x 1 shock of 120J. She feels well overall since then. No missed doses of Eliquis 2.5 mg BID. No episodes of arrhythmia since DCCV.   On follow up 09/02/22, she is currently in rate controlled atrial flutter. Patient's son called on 6/26 noting patient was back in Afib via K[employer] device. They were out of town. She feels tired and SOB when out of rhythm. No missed doses of Eliquis 2.5 mg BID. Daughter of patient notes she has been going in and out of normal rhythm.  On follow up 10/01/22, she is currently in rate  controlled atrial flutter. Cardiac monitor worn after last office visit shows 100% arrhythmia burden. The monitor has not been finalized yet so still waiting to determine if patient only has atrial flutter or also atrial fibrillation. She has upcoming visit with EP. She has completed amiodarone load of 200 mg BID x 2 weeks. She does feel tired frequently. No missed doses of Eliquis 2.5 mg BID.   Today, she denies symptoms of palpitations, chest pain, shortness of breath, orthopnea, PND, lower extremity edema, dizziness, presyncope, syncope, snoring, daytime somnolence, bleeding, or neurologic sequela. The patient is tolerating medications without difficulties and is otherwise without complaint today.   Atrial Fibrillation Risk Factors:  she does not have symptoms or diagnosis of sleep apnea. she does not have a history of rheumatic fever. she does not have a history of alcohol use. The patient does not have a history of early familial atrial fibrillation or other arrhythmias.  she has a BMI of Body mass index is 21.48 kg/m.Marland Kitchen Filed Weights   10/01/22 0854  Weight: 49.9 kg     Family History  Problem Relation Age of Onset   Hypertension Mother    Colon cancer Neg Hx    Breast cancer Neg Hx     Atrial Fibrillation Management history:  Previous antiarrhythmic drugs: None Previous cardioversions: 07/09/22 Previous ablations: None Anticoagulation history: Eliquis 2.5 mg BID   Past Medical History:  Diagnosis Date   Breast cancer (HCC)    Diverticulitis    Hypertension  Uterine cancer [facility])    Past Surgical History:  Procedure Laterality Date   ABDOMINAL HYSTERECTOMY     BREAST LUMPECTOMY     CARDIOVERSION N/A 07/09/2022   Procedure: CARDIOVERSION;  Surgeon: Jodelle Red, MD;  Location: [facility] INVASIVE CV LAB;  Service: Cardiovascular;  Laterality: N/A;   EYE SURGERY      Current Outpatient Medications  Medication Sig Dispense Refill   acetaminophen (TYLENOL) 500 MG  tablet Take 1,000 mg by mouth every 6 (six) hours as needed for moderate pain.     ACIDOPHILUS LACTOBACILLUS PO Take 1 capsule by mouth daily.     amiodarone (PACERONE) 200 MG tablet Take 1 tablet by mouth twice a day for 14 days then daily 45 tablet 0   apixaban (ELIQUIS) 2.5 MG TABS tablet Take 1 tablet (2.5 mg total) by mouth 2 (two) times daily. 60 tablet 3   betamethasone dipropionate 0.05 % lotion as needed.     Cholecalciferol (VITAMIN D3) 25 MCG (1000 UT) CAPS Take 1,000 Units by mouth daily.     citalopram (CELEXA) 10 MG tablet Take 10 mg by mouth daily.     guaiFENesin (MUCINEX) 600 MG 12 hr tablet Take 600 mg by mouth 2 (two) times daily as needed for cough.     lisinopril (PRINIVIL,ZESTRIL) 10 MG tablet Take 10 mg by mouth daily.     magnesium gluconate (MAGONATE) 500 MG tablet Take 500 mg by mouth daily.     methylcellulose (CITRUCEL) oral powder Take 1 packet by mouth daily.     metoprolol succinate (TOPROL-XL) 25 MG 24 hr tablet Take 1 tablet (25 mg total) by mouth daily. 15 tablet 3   Multiple Vitamin (MULTIVITAMIN) capsule Take 1 capsule by mouth daily.     Polyethyl Glycol-Propyl Glycol (LUBRICATING EYE DROPS OP) Place 1 drop into both eyes daily as needed (dry eyes).     polyethylene glycol (MIRALAX / GLYCOLAX) 17 g packet Take 17 g by mouth every other day.     triamcinolone (NASACORT) 55 MCG/ACT AERO nasal inhaler Place 2 sprays into the nose as needed.     No current facility-administered medications for this encounter.    Allergies  Allergen Reactions   Other Swelling and Other (See Comments)    "eye drops" (PVA)    ROS- All systems are reviewed and negative except as per the HPI above.  Physical Exam: Vitals:   10/01/22 0854  BP: 122/78  Pulse: 83  Weight: 49.9 kg  Height: 5' (1.524 m)    GEN- The patient is well appearing, alert and oriented x 3 today.   Neck - no JVD or carotid bruit noted Lungs- Clear to ausculation bilaterally, normal work of  breathing Heart- Irregular rate and rhythm, no murmurs, rubs or gallops, PMI not laterally displaced Extremities- no clubbing, cyanosis, or edema Skin - no rash or ecchymosis noted   Wt Readings from Last 3 Encounters:  10/01/22 49.9 kg  09/02/22 49.7 kg  07/28/22 48.7 kg    EKG today demonstrates  Vent. rate 83 BPM PR interval 210 ms QRS duration 90 ms QT/QTcB 354/415 ms P-R-T axes -31 -36 123 Undetermined rhythm - appears to be atrial flutter Left axis deviation Minimal voltage criteria for LVH, may be normal variant ( R in aVL ) Nonspecific T wave abnormality Abnormal ECG When compared with ECG of 02-Sep-2022 10:36, PREVIOUS ECG IS PRESENT  Echo 07/28/22:  1. Left ventricular ejection fraction, by estimation, is 60 to 65%. Left  ventricular ejection  fraction by 3D volume is 62 %. The left ventricle has  normal function. The left ventricle has no regional wall motion  abnormalities. Left ventricular diastolic   parameters are consistent with Grade II diastolic dysfunction  (pseudonormalization). The average left ventricular global longitudinal  strain is -19.8 %. The global longitudinal strain is normal.   2. Right ventricular systolic function is normal. The right ventricular  size is normal. There is normal pulmonary artery systolic pressure. The  estimated right ventricular systolic pressure is 8.9 mmHg.   3. Left atrial size was mildly dilated.   4. The mitral valve is degenerative. Mild mitral valve regurgitation. No  evidence of mitral stenosis.   5. The aortic valve is normal in structure. Aortic valve regurgitation is  mild to moderate. No aortic stenosis is present. Aortic regurgitation PHT  measures 424 msec. Aortic valve area, by VTI measures 1.39 cm. Aortic  valve mean gradient measures 2.5  mmHg. Aortic valve Vmax measures 1.06 m/s.   6. The inferior vena cava is normal in size with greater than 50%  respiratory variability, suggesting right atrial  pressure of 3 mmHg.   Epic records are reviewed at length today.  CHA2DS2-VASc Score = 4  The patient's score is based upon: CHF History: 0 HTN History: 1 Diabetes History: 0 Stroke History: 0 Vascular Disease History: 0 Age Score: 2 Gender Score: 1      ASSESSMENT AND PLAN: Persistent atrial flutter The patient's CHA2DS2-VASc score is 4, indicating a 4.8% annual risk of stroke.    She is currently in rate controlled atrial flutter. Cardiac monitor has not been finalized but does show 100% arrhythmia burden. Patient began amiodarone bridge to ablation after monitor period and has completed amiodarone bridge. She will transition to amiodarone 200 mg daily. After discussion, pt has agreed to proceed with scheduling DCCV. She will f/u with Dr. Elberta Fortis after DCCV. Labs recently done on 8/14 by PCP show overall stable Bmet and CBC (scanned into chart).  Qtc interval stable.  Informed Consent   Shared Decision Making/Informed Consent The risks (stroke, cardiac arrhythmias rarely resulting in the need for a temporary or permanent pacemaker, skin irritation or burns and complications associated with conscious sedation including aspiration, arrhythmia, respiratory failure and death), benefits (restoration of normal sinus rhythm) and alternatives of a direct current cardioversion were explained in detail to Ms. Hashimoto and she agrees to proceed.      Secondary Hypercoagulable State (ICD10:  D68.69) The patient is at significant risk for stroke/thromboembolism based upon her CHA2DS2-VASc Score of 4.  Continue Apixaban (Eliquis). She meets criteria for Eliquis 2.5 mg BID dosage. Education on compliance with anticoagulation provided. I did advise she discontinue Celebrex due to interaction with Eliquis.   4. Snoring with concern for Obstructive sleep apnea We will defer at this time; she does not appear to have symptoms concerning for OSA.    Follow up as scheduled with Dr. Elberta Fortis.   Lake Bells, PA-C Afib Clinic [facility] [address] [place], [place] [address] [phone] 10/01/2022 11:26 AM

## 2022-10-01 NOTE — Progress Notes (Signed)
Primary Care Physician: Felipa Furnace, NP Primary Cardiologist: None Primary Electrophysiologist: None Referring Physician: [facility]   Chelsea Petersen is a 82 y.o. female with a history of HTN, remote history of breast cancer and uterine cancer s/p hysterectomy, diverticulitis, SBO, and atrial fibrillation who presents for consultation in the [facility]. The patient was initially diagnosed with atrial fibrillation on ED visit 06/07/22 when she was evaluated due to palpitations for a couple of weeks. She notes irregular heartbeat notifications on her fitness watch. She was in Afib with RVR in ED (low 100s without rate control medication). Discharged on Toprol 12.5 mg qhs and Eliquis 2.5 mg BID. Patient is on Eliquis 2.5 mg BID for a CHADS2VASC score of 4. She is eligible to take lower dose of Eliquis due to age and weight.   On evaluation today, she is currently in rate controlled atrial flutter. She does notice shortness of breath with exertion. Her Fitbit watch will provide HR alerts but not a rhythm strip.   She does not drink alcohol or excessive amounts of caffeine. She snores some but no stop breathing; feels well rested after she wakes up.   She is compliant with anticoagulation and has not missed any doses. She has no bleeding concerns.  On follow up 07/28/22, she is currently in NSR. She is s/p successful DCCV on 07/09/22 with conversion to NSR x 1 shock of 120J. She feels well overall since then. No missed doses of Eliquis 2.5 mg BID. No episodes of arrhythmia since DCCV.   On follow up 09/02/22, she is currently in rate controlled atrial flutter. Patient's son called on 6/26 noting patient was back in Afib via [employer] device. They were out of town. She feels tired and SOB when out of rhythm. No missed doses of Eliquis 2.5 mg BID. Daughter of patient notes she has been going in and out of normal rhythm.  On follow up 10/01/22, she is currently in rate  controlled atrial flutter. Cardiac monitor worn after last office visit shows 100% arrhythmia burden. The monitor has not been finalized yet so still waiting to determine if patient only has atrial flutter or also atrial fibrillation. She has upcoming visit with EP. She has completed amiodarone load of 200 mg BID x 2 weeks. She does feel tired frequently. No missed doses of Eliquis 2.5 mg BID.   Today, she denies symptoms of palpitations, chest pain, shortness of breath, orthopnea, PND, lower extremity edema, dizziness, presyncope, syncope, snoring, daytime somnolence, bleeding, or neurologic sequela. The patient is tolerating medications without difficulties and is otherwise without complaint today.   Atrial Fibrillation Risk Factors:  she does not have symptoms or diagnosis of sleep apnea. she does not have a history of rheumatic fever. she does not have a history of alcohol use. The patient does not have a history of early familial atrial fibrillation or other arrhythmias.  she has a BMI of Body mass index is 21.48 kg/m.Marland Kitchen Filed Weights   10/01/22 0854  Weight: 49.9 kg     Family History  Problem Relation Age of Onset   Hypertension Mother    Colon cancer Neg Hx    Breast cancer Neg Hx     Atrial Fibrillation Management history:  Previous antiarrhythmic drugs: None Previous cardioversions: 07/09/22 Previous ablations: None Anticoagulation history: Eliquis 2.5 mg BID   Past Medical History:  Diagnosis Date   Breast cancer (HCC)    Diverticulitis    Hypertension  Uterine cancer [facility])    Past Surgical History:  Procedure Laterality Date   ABDOMINAL HYSTERECTOMY     BREAST LUMPECTOMY     CARDIOVERSION N/A 07/09/2022   Procedure: CARDIOVERSION;  Surgeon: Jodelle Red, MD;  Location: [facility] INVASIVE CV LAB;  Service: Cardiovascular;  Laterality: N/A;   EYE SURGERY      Current Outpatient Medications  Medication Sig Dispense Refill   acetaminophen (TYLENOL) 500 MG  tablet Take 1,000 mg by mouth every 6 (six) hours as needed for moderate pain.     ACIDOPHILUS LACTOBACILLUS PO Take 1 capsule by mouth daily.     amiodarone (PACERONE) 200 MG tablet Take 1 tablet by mouth twice a day for 14 days then daily 45 tablet 0   apixaban (ELIQUIS) 2.5 MG TABS tablet Take 1 tablet (2.5 mg total) by mouth 2 (two) times daily. 60 tablet 3   betamethasone dipropionate 0.05 % lotion as needed.     Cholecalciferol (VITAMIN D3) 25 MCG (1000 UT) CAPS Take 1,000 Units by mouth daily.     citalopram (CELEXA) 10 MG tablet Take 10 mg by mouth daily.     guaiFENesin (MUCINEX) 600 MG 12 hr tablet Take 600 mg by mouth 2 (two) times daily as needed for cough.     lisinopril (PRINIVIL,ZESTRIL) 10 MG tablet Take 10 mg by mouth daily.     magnesium gluconate (MAGONATE) 500 MG tablet Take 500 mg by mouth daily.     methylcellulose (CITRUCEL) oral powder Take 1 packet by mouth daily.     metoprolol succinate (TOPROL-XL) 25 MG 24 hr tablet Take 1 tablet (25 mg total) by mouth daily. 15 tablet 3   Multiple Vitamin (MULTIVITAMIN) capsule Take 1 capsule by mouth daily.     Polyethyl Glycol-Propyl Glycol (LUBRICATING EYE DROPS OP) Place 1 drop into both eyes daily as needed (dry eyes).     polyethylene glycol (MIRALAX / GLYCOLAX) 17 g packet Take 17 g by mouth every other day.     triamcinolone (NASACORT) 55 MCG/ACT AERO nasal inhaler Place 2 sprays into the nose as needed.     No current facility-administered medications for this encounter.    Allergies  Allergen Reactions   Other Swelling and Other (See Comments)    "eye drops" (PVA)    ROS- All systems are reviewed and negative except as per the HPI above.  Physical Exam: Vitals:   10/01/22 0854  BP: 122/78  Pulse: 83  Weight: 49.9 kg  Height: 5' (1.524 m)    GEN- The patient is well appearing, alert and oriented x 3 today.   Neck - no JVD or carotid bruit noted Lungs- Clear to ausculation bilaterally, normal work of  breathing Heart- Irregular rate and rhythm, no murmurs, rubs or gallops, PMI not laterally displaced Extremities- no clubbing, cyanosis, or edema Skin - no rash or ecchymosis noted   Wt Readings from Last 3 Encounters:  10/01/22 49.9 kg  09/02/22 49.7 kg  07/28/22 48.7 kg    EKG today demonstrates  Vent. rate 83 BPM PR interval 210 ms QRS duration 90 ms QT/QTcB 354/415 ms P-R-T axes -31 -36 123 Undetermined rhythm - appears to be atrial flutter Left axis deviation Minimal voltage criteria for LVH, may be normal variant ( R in aVL ) Nonspecific T wave abnormality Abnormal ECG When compared with ECG of 02-Sep-2022 10:36, PREVIOUS ECG IS PRESENT  Echo 07/28/22:  1. Left ventricular ejection fraction, by estimation, is 60 to 65%. Left  ventricular ejection  fraction by 3D volume is 62 %. The left ventricle has  normal function. The left ventricle has no regional wall motion  abnormalities. Left ventricular diastolic   parameters are consistent with Grade II diastolic dysfunction  (pseudonormalization). The average left ventricular global longitudinal  strain is -19.8 %. The global longitudinal strain is normal.   2. Right ventricular systolic function is normal. The right ventricular  size is normal. There is normal pulmonary artery systolic pressure. The  estimated right ventricular systolic pressure is 8.9 mmHg.   3. Left atrial size was mildly dilated.   4. The mitral valve is degenerative. Mild mitral valve regurgitation. No  evidence of mitral stenosis.   5. The aortic valve is normal in structure. Aortic valve regurgitation is  mild to moderate. No aortic stenosis is present. Aortic regurgitation PHT  measures 424 msec. Aortic valve area, by VTI measures 1.39 cm. Aortic  valve mean gradient measures 2.5  mmHg. Aortic valve Vmax measures 1.06 m/s.   6. The inferior vena cava is normal in size with greater than 50%  respiratory variability, suggesting right atrial  pressure of 3 mmHg.   Epic records are reviewed at length today.  CHA2DS2-VASc Score = 4  The patient's score is based upon: CHF History: 0 HTN History: 1 Diabetes History: 0 Stroke History: 0 Vascular Disease History: 0 Age Score: 2 Gender Score: 1      ASSESSMENT AND PLAN: Persistent atrial flutter The patient's CHA2DS2-VASc score is 4, indicating a 4.8% annual risk of stroke.    She is currently in rate controlled atrial flutter. Cardiac monitor has not been finalized but does show 100% arrhythmia burden. Patient began amiodarone bridge to ablation after monitor period and has completed amiodarone bridge. She will transition to amiodarone 200 mg daily. After discussion, pt has agreed to proceed with scheduling DCCV. She will f/u with Dr. Elberta Fortis after DCCV. Labs recently done on 8/14 by PCP show overall stable Bmet and CBC (scanned into chart).  Qtc interval stable.  Informed Consent   Shared Decision Making/Informed Consent The risks (stroke, cardiac arrhythmias rarely resulting in the need for a temporary or permanent pacemaker, skin irritation or burns and complications associated with conscious sedation including aspiration, arrhythmia, respiratory failure and death), benefits (restoration of normal sinus rhythm) and alternatives of a direct current cardioversion were explained in detail to Ms. Hashimoto and she agrees to proceed.      Secondary Hypercoagulable State (ICD10:  D68.69) The patient is at significant risk for stroke/thromboembolism based upon her CHA2DS2-VASc Score of 4.  Continue Apixaban (Eliquis). She meets criteria for Eliquis 2.5 mg BID dosage. Education on compliance with anticoagulation provided. I did advise she discontinue Celebrex due to interaction with Eliquis.   4. Snoring with concern for Obstructive sleep apnea We will defer at this time; she does not appear to have symptoms concerning for OSA.    Follow up as scheduled with Dr. Elberta Fortis.   Lake Bells, PA-C Afib [facility] [address] [place], [place] [address] [phone] 10/01/2022 11:26 AM

## 2022-10-15 NOTE — Progress Notes (Signed)
 Spoke to pt and instructed them to come at 0945 and to be NPO after 0000. Confirmed no missed doses of AC and instructed to take in AM with a small sip of water.   Confirmed that pt will have a ride home and someone to stay with them for 24 hours after the procedure.

## 2022-10-18 ENCOUNTER — Other Ambulatory Visit (HOSPITAL_COMMUNITY): Payer: Self-pay | Admitting: Internal Medicine

## 2022-10-19 ENCOUNTER — Encounter (HOSPITAL_COMMUNITY): Payer: Self-pay | Admitting: Internal Medicine

## 2022-10-19 ENCOUNTER — Encounter (HOSPITAL_COMMUNITY)
Admission: RE | Disposition: A | Discharge status: Home / Self Care | Payer: Self-pay | Source: Home / Self Care | Attending: Cardiology

## 2022-10-19 ENCOUNTER — Ambulatory Visit (HOSPITAL_BASED_OUTPATIENT_CLINIC_OR_DEPARTMENT_OTHER): Payer: Medicare Other | Admitting: Certified Registered Nurse Anesthetist

## 2022-10-19 ENCOUNTER — Ambulatory Visit (HOSPITAL_COMMUNITY): Payer: Medicare Other | Admitting: Certified Registered Nurse Anesthetist

## 2022-10-19 ENCOUNTER — Ambulatory Visit (HOSPITAL_COMMUNITY)
Admission: RE | Admit: 2022-10-19 | Discharge: 2022-10-19 | Disposition: A | Discharge status: Home / Self Care | Payer: Medicare Other | Attending: Cardiology | Admitting: Cardiology

## 2022-10-19 ENCOUNTER — Other Ambulatory Visit: Payer: Self-pay

## 2022-10-19 DIAGNOSIS — I10 Essential (primary) hypertension: Secondary | ICD-10-CM

## 2022-10-19 DIAGNOSIS — Z8542 Personal history of malignant neoplasm of other parts of uterus: Secondary | ICD-10-CM | POA: Diagnosis not present

## 2022-10-19 DIAGNOSIS — I4892 Unspecified atrial flutter: Secondary | ICD-10-CM | POA: Insufficient documentation

## 2022-10-19 DIAGNOSIS — R0683 Snoring: Secondary | ICD-10-CM | POA: Insufficient documentation

## 2022-10-19 DIAGNOSIS — I4891 Unspecified atrial fibrillation: Secondary | ICD-10-CM | POA: Insufficient documentation

## 2022-10-19 DIAGNOSIS — Z87891 Personal history of nicotine dependence: Secondary | ICD-10-CM | POA: Diagnosis not present

## 2022-10-19 DIAGNOSIS — D6869 Other thrombophilia: Secondary | ICD-10-CM | POA: Diagnosis not present

## 2022-10-19 DIAGNOSIS — Z7901 Long term (current) use of anticoagulants: Secondary | ICD-10-CM | POA: Insufficient documentation

## 2022-10-19 DIAGNOSIS — Z79899 Other long term (current) drug therapy: Secondary | ICD-10-CM | POA: Insufficient documentation

## 2022-10-19 HISTORY — PX: CARDIOVERSION: SHX1299

## 2022-10-19 SURGERY — CARDIOVERSION
Anesthesia: General

## 2022-10-19 MED ORDER — LIDOCAINE 2% (20 MG/ML) 5 ML SYRINGE
INTRAMUSCULAR | Status: DC | PRN
  Administered 2022-10-19: 40 mg via INTRAVENOUS

## 2022-10-19 MED ORDER — SODIUM CHLORIDE 0.9 % IV SOLN: INTRAVENOUS | Status: DC

## 2022-10-19 MED ORDER — PROPOFOL 10 MG/ML IV BOLUS
INTRAVENOUS | Status: DC | PRN
  Administered 2022-10-19: 60 mg via INTRAVENOUS

## 2022-10-19 SURGICAL SUPPLY — 1 items: ELECT DEFIB PAD ADLT CADENCE (PAD) ×1 IMPLANT

## 2022-10-19 NOTE — Interval H&P Note (Signed)
History and Physical Interval Note:  10/19/2022 10:04 AM  Chelsea Petersen  has presented today for surgery, with the diagnosis of AFLUTTER.  The various methods of treatment have been discussed with the patient and family. After consideration of risks, benefits and other options for treatment, the patient has consented to  Procedure(s): CARDIOVERSION (N/A) as a surgical intervention.  The patient's history has been reviewed, patient examined, no change in status, stable for surgery.  I have reviewed the patient's chart and labs.  Questions were answered to the patient's satisfaction.     [name]

## 2022-10-19 NOTE — Anesthesia Postprocedure Evaluation (Signed)
Anesthesia Post Note  Patient: Chelsea Petersen  Procedure(s) Performed: CARDIOVERSION     Patient location during evaluation: PACU Anesthesia Type: General Level of consciousness: awake and alert Pain management: pain level controlled Vital Signs Assessment: post-procedure vital signs reviewed and stable Respiratory status: spontaneous breathing, nonlabored ventilation, respiratory function stable and patient connected to nasal cannula oxygen Cardiovascular status: blood pressure returned to baseline and stable Postop Assessment: no apparent nausea or vomiting Anesthetic complications: no   No notable events documented.  Last Vitals:  Vitals:   10/19/22 1120 10/19/22 1125  BP: (!) 146/56 (!) 148/54  Pulse: (!) 52 (!) 55  Resp: 17 18  Temp:  36.7 C  SpO2: 98% 100%    Last Pain:  Vitals:   10/19/22 1125  TempSrc: Temporal  PainSc: 0-No pain                 [place]

## 2022-10-19 NOTE — Transfer of Care (Signed)
Immediate Anesthesia Transfer of Care Note  Patient: Chelsea Petersen  Procedure(s) Performed: CARDIOVERSION  Patient Location: Cath Lab  Anesthesia Type:General  Level of Consciousness: drowsy and patient cooperative  Airway & Oxygen Therapy: Patient Spontanous Breathing and Patient connected to nasal cannula oxygen  Post-op Assessment: Report given to RN, Post -op Vital signs reviewed and stable, and Patient moving all extremities X 4  Post vital signs: Reviewed and stable  Last Vitals:  Vitals Value Taken Time  BP 131/62 10/19/22 1101  Temp    Pulse 57 10/19/22 1104  Resp 21 10/19/22 1104  SpO2 93 % 10/19/22 1104    Last Pain:  Vitals:   10/19/22 1007  TempSrc:   PainSc: 3          Complications: No notable events documented.

## 2022-10-19 NOTE — Anesthesia Preprocedure Evaluation (Addendum)
Anesthesia Evaluation  Patient identified by MRN, date of birth, ID band Patient awake    Reviewed: Allergy & Precautions, NPO status , Patient's Chart, lab work & pertinent test results  History of Anesthesia Complications Negative for: history of anesthetic complications  Airway Mallampati: II  TM Distance: >3 FB Neck ROM: Full    Dental  (+) Dental Advisory Given   Pulmonary former smoker   Pulmonary exam normal breath sounds clear to auscultation       Cardiovascular hypertension, Pt. on medications and Pt. on home beta blockers (-) angina (-) Past MI, (-) Cardiac Stents and (-) CABG + dysrhythmias Atrial Fibrillation  Rhythm:Regular Rate:Normal     Neuro/Psych negative neurological ROS     GI/Hepatic negative GI ROS, Neg liver ROS,,,  Endo/Other  negative endocrine ROS    Renal/GU negative Renal ROS     Musculoskeletal   Abdominal   Peds  Hematology negative hematology ROS (+)   Anesthesia Other Findings H/o breast cancer, h/o uterine cancer  Reproductive/Obstetrics                              Anesthesia Physical Anesthesia Plan  ASA: 3  Anesthesia Plan: General   Post-op Pain Management: Minimal or no pain anticipated   Induction: Intravenous  PONV Risk Score and Plan: 3 and Treatment may vary due to age or medical condition  Airway Management Planned: Natural Airway and Mask  Additional Equipment:   Intra-op Plan:   Post-operative Plan:   Informed Consent: I have reviewed the patients History and Physical, chart, labs and discussed the procedure including the risks, benefits and alternatives for the proposed anesthesia with the patient or authorized representative who has indicated his/her understanding and acceptance.     Dental advisory given  Plan Discussed with: CRNA and Anesthesiologist  Anesthesia Plan Comments: ( )         Anesthesia Quick  Evaluation

## 2022-10-20 NOTE — CV Procedure (Signed)
   TRANSESOPHAGEAL ECHOCARDIOGRAM GUIDED DIRECT CURRENT CARDIOVERSION  NAME:  Chelsea Petersen   MRN: [id number] DOB:  1940-11-22   ADMIT DATE: 10/19/2022  INDICATIONS:  Atrial fibrillation  PROCEDURE:   Informed consent was obtained prior to the procedure. The risks, benefits and alternatives for the procedure were discussed and the patient comprehended these risks.  Risks include, but are not limited to, cough, sore throat, vomiting, nausea, somnolence, esophageal and stomach trauma or perforation, bleeding, low blood pressure, aspiration, pneumonia, infection, trauma to the teeth and death.    After a procedural time-out, the patient was sedated by the anesthesia service. Once an appropriate level of sedation was achieved, the transesophageal probe was inserted in the esophagus and stomach without difficulty and multiple views were obtained.   FINDINGS:  LEFT VENTRICLE: EF = 70% severe asymmetric septal hypertophy c/w HOCM. LVOT gradient not assessed.   RIGHT VENTRICLE: Normal  LEFT ATRIUM: Massively dilated  LEFT ATRIAL APPENDAGE: No clot  RIGHT ATRIUM: Moderately dilated  AORTIC VALVE:  Trileaflet. Mildly calcified Trivial AI  MITRAL VALVE:    + MAC mild MR  TRICUSPID VALVE: Moderate TR  PULMONIC VALVE: Trivial PR  INTERATRIAL SEPTUM: No PFO/ASD  PERICARDIUM: Absent  DESCENDING AORTA: Not well visualized   CARDIOVERSION:     Indications:  Atrial Fibrillation  Procedure Details:  Once the TEE was complete, the patient had the defibrillator pads placed in the anterior and posterior position. Once an appropriate level of sedation was achieved, the patient received a single biphasic, synchronized 200J shock with prompt conversion to sinus rhythm. No apparent complications.   Arvilla Meres, MD  1:22 PM

## 2022-11-02 ENCOUNTER — Encounter: Payer: Self-pay | Admitting: Cardiology

## 2022-11-02 ENCOUNTER — Ambulatory Visit: Payer: Medicare Other | Attending: Cardiology | Admitting: Cardiology

## 2022-11-02 ENCOUNTER — Ambulatory Visit: Payer: Medicare Other | Admitting: Cardiology

## 2022-11-02 VITALS — BP 120/82 | HR 70 | Ht 60.0 in | Wt 106.0 lb

## 2022-11-02 DIAGNOSIS — I483 Typical atrial flutter: Secondary | ICD-10-CM | POA: Insufficient documentation

## 2022-11-02 DIAGNOSIS — D6869 Other thrombophilia: Secondary | ICD-10-CM | POA: Insufficient documentation

## 2022-11-02 DIAGNOSIS — I10 Essential (primary) hypertension: Secondary | ICD-10-CM | POA: Diagnosis present

## 2022-11-02 NOTE — Progress Notes (Signed)
Electrophysiology Office Note:   Date:  11/02/2022  ID:  Chelsea Petersen, DOB 11/15/1940, MRN [id number]  Primary Cardiologist: None Electrophysiologist: None      History of Present Illness:   Chelsea Petersen is a 82 y.o. female with h/o hypertension, breast cancer, atrial flutter seen today for  for Electrophysiology evaluation of atrial flutter at the request of Chelsea Petersen.    She was diagnosed with atrial flutter 06/07/2022.  She checked her fitness watch and was diagnosed based off of that.  She then presented to atrial flutter clinic and rate controlled a flutter.  She is post cardioversion 10/19/2022.  She does not drink excessive alcohol or caffeine.  She has had multiple cardioversions.  She wore a cardiac monitor with a 100% burden prior to cardioversion.  She is now on amiodarone.  She feels tired and fatigued as well as short of breath when she is in flutter.  Since being started on the amiodarone, she has had no further episodes of atrial flutter.  She feels better now than she has in the past few months.  She has much more energy and less fatigue and shortness of breath.  Review of systems complete and found to be negative unless listed in HPI.   EP Information / Studies Reviewed:    EKG is ordered today. Personal review as below.  EKG Interpretation Date/Time:  Tuesday November 02 2022 13:57:21 EDT Ventricular Rate:  70 PR Interval:  208 QRS Duration:  84 QT Interval:  416 QTC Calculation: 449 R Axis:   92  Text Interpretation: Sinus rhythm with Premature atrial complexes Possible Left atrial enlargement Rightward axis Biventricular hypertrophy with repolarization abnormality When compared with ECG of 19-Oct-2022 11:07, No significant change was found Confirmed by Chelsea Petersen ([id number]) on 11/02/2022 2:14:27 PM     Risk Assessment/Calculations:    CHA2DS2-VASc Score = 4   This indicates a 4.8% annual risk of stroke. The patient's score is based upon: CHF History:  0 HTN History: 1 Diabetes History: 0 Stroke History: 0 Vascular Disease History: 0 Age Score: 2 Gender Score: 1             Physical Exam:   VS:  BP 120/82 (BP Location: Right Arm, Patient Position: Sitting, Cuff Size: Normal)   Pulse 70   Ht 5' (1.524 m)   Wt 106 lb (48.1 kg)   SpO2 97%   BMI 20.70 kg/m    Wt Readings from Last 3 Encounters:  11/02/22 106 lb (48.1 kg)  10/19/22 110 lb (49.9 kg)  10/01/22 110 lb (49.9 kg)     GEN: Well nourished, well developed in no acute distress NECK: No JVD; No carotid bruits CARDIAC: Regular rate and rhythm, no murmurs, rubs, gallops RESPIRATORY:  Clear to auscultation without rales, wheezing or rhonchi  ABDOMEN: Soft, non-tender, non-distended EXTREMITIES:  No edema; No deformity   ASSESSMENT AND PLAN:    1.  Typical atrial flutter: Feeling well currently.  On amiodarone post cardioversion.  We discussed further management for her atrial flutter for rhythm control.  We discussed both ablation and management with amiodarone.  Risk and benefits of ablation were discussed and include bleeding, tamponade, heart block, stroke, damage to chest organs, MI, renal failure, death.  She understands the risks.  She would like to discuss this further with her family.  She Chelsea Petersen call us back and let us know.  2.  Secondary hypercoagulable state: Currently on Eliquis for atrial fibrillation  3.  Hypertension: Currently well-controlled  Follow up with Dr. Elberta Fortis in 6 months  Signed, Deiona Hooper Jorja Loa, MD

## 2022-11-03 ENCOUNTER — Other Ambulatory Visit (HOSPITAL_COMMUNITY): Payer: Self-pay | Admitting: *Deleted

## 2022-11-03 MED ORDER — METOPROLOL SUCCINATE ER 25 MG PO TB24: 25.0000 mg | ORAL_TABLET | Freq: Every day | ORAL | 2 refills | Status: AC

## 2022-11-17 ENCOUNTER — Ambulatory Visit (HOSPITAL_COMMUNITY): Payer: Medicare Other | Admitting: Internal Medicine

## 2022-11-30 ENCOUNTER — Other Ambulatory Visit (HOSPITAL_COMMUNITY): Payer: Self-pay | Admitting: Internal Medicine

## 2023-04-18 ENCOUNTER — Ambulatory Visit: Payer: Medicare Other | Admitting: Student

## 2023-07-03 ENCOUNTER — Encounter (HOSPITAL_COMMUNITY): Payer: Self-pay

## 2023-07-03 ENCOUNTER — Emergency Department (HOSPITAL_COMMUNITY)
Admission: EM | Admit: 2023-07-03 | Discharge: 2023-07-04 | Disposition: A | Discharge status: Home / Self Care | Attending: Emergency Medicine | Admitting: Emergency Medicine

## 2023-07-03 ENCOUNTER — Emergency Department (HOSPITAL_COMMUNITY)

## 2023-07-03 ENCOUNTER — Other Ambulatory Visit: Payer: Self-pay

## 2023-07-03 DIAGNOSIS — D72829 Elevated white blood cell count, unspecified: Secondary | ICD-10-CM | POA: Diagnosis not present

## 2023-07-03 DIAGNOSIS — R519 Headache, unspecified: Secondary | ICD-10-CM | POA: Insufficient documentation

## 2023-07-03 DIAGNOSIS — Z23 Encounter for immunization: Secondary | ICD-10-CM | POA: Insufficient documentation

## 2023-07-03 DIAGNOSIS — Z79899 Other long term (current) drug therapy: Secondary | ICD-10-CM | POA: Diagnosis not present

## 2023-07-03 DIAGNOSIS — S0191XA Laceration without foreign body of unspecified part of head, initial encounter: Secondary | ICD-10-CM | POA: Diagnosis not present

## 2023-07-03 DIAGNOSIS — W01198A Fall on same level from slipping, tripping and stumbling with subsequent striking against other object, initial encounter: Secondary | ICD-10-CM | POA: Insufficient documentation

## 2023-07-03 DIAGNOSIS — W19XXXA Unspecified fall, initial encounter: Secondary | ICD-10-CM

## 2023-07-03 DIAGNOSIS — I10 Essential (primary) hypertension: Secondary | ICD-10-CM | POA: Diagnosis not present

## 2023-07-03 DIAGNOSIS — Z7901 Long term (current) use of anticoagulants: Secondary | ICD-10-CM | POA: Diagnosis not present

## 2023-07-03 DIAGNOSIS — S81811A Laceration without foreign body, right lower leg, initial encounter: Secondary | ICD-10-CM | POA: Diagnosis not present

## 2023-07-03 DIAGNOSIS — S8991XA Unspecified injury of right lower leg, initial encounter: Secondary | ICD-10-CM | POA: Diagnosis present

## 2023-07-03 LAB — CBC WITH DIFFERENTIAL/PLATELET
Abs Immature Granulocytes: 0.57 10*3/uL — ABNORMAL HIGH (ref 0.00–0.07)
Basophils Absolute: 0.1 10*3/uL (ref 0.0–0.1)
Basophils Relative: 0 %
Eosinophils Absolute: 0 10*3/uL (ref 0.0–0.5)
Eosinophils Relative: 0 %
HCT: 33.5 % — ABNORMAL LOW (ref 36.0–46.0)
Hemoglobin: 11.2 g/dL — ABNORMAL LOW (ref 12.0–15.0)
Immature Granulocytes: 3 %
Lymphocytes Relative: 3 %
Lymphs Abs: 0.6 10*3/uL — ABNORMAL LOW (ref 0.7–4.0)
MCH: 29.8 pg (ref 26.0–34.0)
MCHC: 33.4 g/dL (ref 30.0–36.0)
MCV: 89.1 fL (ref 80.0–100.0)
Monocytes Absolute: 1 10*3/uL (ref 0.1–1.0)
Monocytes Relative: 5 %
Neutro Abs: 16.3 10*3/uL — ABNORMAL HIGH (ref 1.7–7.7)
Neutrophils Relative %: 89 %
Platelets: 311 10*3/uL (ref 150–400)
RBC: 3.76 MIL/uL — ABNORMAL LOW (ref 3.87–5.11)
RDW: 19.9 % — ABNORMAL HIGH (ref 11.5–15.5)
WBC: 18.5 10*3/uL — ABNORMAL HIGH (ref 4.0–10.5)
nRBC: 0 % (ref 0.0–0.2)

## 2023-07-03 LAB — COMPREHENSIVE METABOLIC PANEL WITH GFR
ALT: 38 U/L (ref 0–44)
AST: 33 U/L (ref 15–41)
Albumin: 3.5 g/dL (ref 3.5–5.0)
Alkaline Phosphatase: 65 U/L (ref 38–126)
Anion gap: 11 (ref 5–15)
BUN: 17 mg/dL (ref 8–23)
CO2: 24 mmol/L (ref 22–32)
Calcium: 9.2 mg/dL (ref 8.9–10.3)
Chloride: 89 mmol/L — ABNORMAL LOW (ref 98–111)
Creatinine, Ser: 0.89 mg/dL (ref 0.44–1.00)
GFR, Estimated: >60 mL/min (ref >60)
Glucose, Bld: 193 mg/dL — ABNORMAL HIGH (ref 70–99)
Potassium: 3.8 mmol/L (ref 3.5–5.1)
Sodium: 124 mmol/L — ABNORMAL LOW (ref 135–145)
Total Bilirubin: 0.9 mg/dL (ref 0.0–1.2)
Total Protein: 6.3 g/dL — ABNORMAL LOW (ref 6.5–8.1)

## 2023-07-03 LAB — URINALYSIS, ROUTINE W REFLEX MICROSCOPIC
Bilirubin Urine: NEGATIVE
Glucose, UA: 50 mg/dL — AB
Ketones, ur: NEGATIVE mg/dL
Leukocytes,Ua: NEGATIVE
Nitrite: NEGATIVE
Protein, ur: NEGATIVE mg/dL
Specific Gravity, Urine: 1.008 (ref 1.005–1.030)
pH: 7 (ref 5.0–8.0)

## 2023-07-03 LAB — PROTIME-INR
INR: 1 (ref 0.8–1.2)
Prothrombin Time: 12.9 s (ref 11.4–15.2)

## 2023-07-03 LAB — I-STAT CHEM 8, ED
BUN: 19 mg/dL (ref 8–23)
Calcium, Ion: 1.18 mmol/L (ref 1.15–1.40)
Chloride: 91 mmol/L — ABNORMAL LOW (ref 98–111)
Creatinine, Ser: 0.8 mg/dL (ref 0.44–1.00)
Glucose, Bld: 196 mg/dL — ABNORMAL HIGH (ref 70–99)
HCT: 34 % — ABNORMAL LOW (ref 36.0–46.0)
Hemoglobin: 11.6 g/dL — ABNORMAL LOW (ref 12.0–15.0)
Potassium: 4 mmol/L (ref 3.5–5.1)
Sodium: 125 mmol/L — ABNORMAL LOW (ref 135–145)
TCO2: 25 mmol/L (ref 22–32)

## 2023-07-03 LAB — I-STAT CG4 LACTIC ACID, ED: Lactic Acid, Venous: 1.4 mmol/L (ref 0.5–1.9)

## 2023-07-03 MED ORDER — CEPHALEXIN 250 MG PO CAPS
500.0000 mg | ORAL_CAPSULE | Freq: Once | ORAL | Status: AC
  Administered 2023-07-03: 500 mg via ORAL
  Filled 2023-07-03: qty 2

## 2023-07-03 MED ORDER — ACETAMINOPHEN 500 MG PO TABS
1000.0000 mg | ORAL_TABLET | Freq: Once | ORAL | Status: AC
  Administered 2023-07-03: 1000 mg via ORAL
  Filled 2023-07-03: qty 2

## 2023-07-03 MED ORDER — CEPHALEXIN 500 MG PO CAPS: 500.0000 mg | ORAL_CAPSULE | Freq: Two times a day (BID) | ORAL | 0 refills | Status: AC

## 2023-07-03 MED ORDER — TETANUS-DIPHTH-ACELL PERTUSSIS 5-2.5-18.5 LF-MCG/0.5 IM SUSY
0.5000 mL | PREFILLED_SYRINGE | Freq: Once | INTRAMUSCULAR | Status: AC
  Administered 2023-07-03: 0.5 mL via INTRAMUSCULAR
  Filled 2023-07-03: qty 0.5

## 2023-07-03 MED ORDER — LIDOCAINE-EPINEPHRINE (PF) 2 %-1:200000 IJ SOLN
INTRAMUSCULAR | Status: AC
Start: 2023-07-03 — End: 2023-07-03
  Administered 2023-07-03: 20 mL via INTRADERMAL
  Filled 2023-07-03: qty 20

## 2023-07-03 MED ORDER — LIDOCAINE-EPINEPHRINE (PF) 2 %-1:200000 IJ SOLN: 20.0000 mL | Freq: Once | INTRAMUSCULAR | Status: AC

## 2023-07-03 NOTE — ED Notes (Signed)
 Wrapped patient leg up placed a non stick gauges and some co band patient is resting with call bell in reach and family at bedside

## 2023-07-03 NOTE — ED Triage Notes (Signed)
 Pt bib ems for mechanical fall with walker around 430 am, pt has laceration to right lower leg, pt wrapped it and waited til this morning to call her family. Pt also has a contusion to the right posterior head, pt on Eliquis .  Pain 2/10 Pt a.o, denies dizziness

## 2023-07-03 NOTE — Discharge Instructions (Addendum)
 You were given a prescription for Keflex, please take 1 tablet twice a day for the next 7 days.  He also had 2 stitches placed to your right leg, you will need to continue with a compression dressing to this area.  Please have the stitches removed within 7 to 10 days.  If you experience any drainage from the wound please return to the emergency department.

## 2023-07-03 NOTE — ED Provider Notes (Signed)
 [place] EMERGENCY DEPARTMENT AT [facility] Provider Note   CSN: [id number] Arrival date & time: 07/03/23  1119     History HTN, Atrial flutter on eliquis  Chief Complaint  Patient presents with   Fall    Chelsea Petersen is a 83 y.o. female.  83 y.o female with a PMH of HTN, Diverticulitis presents to the ED status post mechanical fall.  Patient reports Chelsea Petersen was in her bedroom trying to stand up from her bed when suddenly Chelsea Petersen had her legs go from under her, Chelsea Petersen reports striking her head on the walker, also injured her right lower leg against the walker where there is a large laceration noted.  Chelsea Petersen is currently anticoagulated on Eliquis , Chelsea Petersen is not sure why.  However, according to records Chelsea Petersen has a prior history of atrial flutter.  Chelsea Petersen endorses pain along where Chelsea Petersen injured her head.  Chelsea Petersen was not able to stand since the injury occurred, reports Chelsea Petersen crawled over to her dresser and pulled her weight up.  Her last immunization is unknown.  Chelsea Petersen has not taken any medications to improve with her symptoms.  Chelsea Petersen denies any loss of consciousness, no nausea, no vomiting, no shortness of breath or dizziness.    The history is provided by the patient.  Fall Associated symptoms include headaches. Pertinent negatives include no chest pain, no abdominal pain and no shortness of breath.       Home Medications Prior to Admission medications   Medication Sig Start Date End Date Taking? Authorizing Provider  cephALEXin (KEFLEX) 500 MG capsule Take 1 capsule (500 mg total) by mouth 2 (two) times daily for 7 days. 07/03/23 07/10/23 Yes Yarlin Breisch, PA-C  acetaminophen  (TYLENOL ) 500 MG tablet Take 1,000 mg by mouth every 6 (six) hours as needed for moderate pain.    [provider]  ACIDOPHILUS LACTOBACILLUS PO Take 1 capsule by mouth daily.    [provider]  amiodarone  (PACERONE ) 200 MG tablet TAKE 1 TABLET (200 MG TOTAL) BY MOUTH DAILY. 12/01/22   Nathanel Bal, PA-C   apixaban  (ELIQUIS ) 2.5 MG TABS tablet Take 2.5 mg by mouth 2 (two) times daily.    [provider]  betamethasone dipropionate 0.05 % lotion Apply 1 application  topically as needed (irritation). 04/03/19   [provider]  Cholecalciferol  (VITAMIN D3) 25 MCG (1000 UT) CAPS Take 1,000 Units by mouth daily.    [provider]  citalopram (CELEXA) 10 MG tablet Take 10 mg by mouth daily. 08/13/21   [provider]  guaiFENesin (MUCINEX) 600 MG 12 hr tablet Take 600 mg by mouth 2 (two) times daily as needed for cough. 03/25/20   [provider]  lisinopril  (PRINIVIL ,ZESTRIL ) 10 MG tablet Take 10 mg by mouth daily. 02/10/18   [provider]  magnesium gluconate (MAGONATE) 500 MG tablet Take 500 mg by mouth daily.    [provider]  methylcellulose (CITRUCEL) oral powder Take 1 packet by mouth daily. 08/25/18   [provider]  metoprolol  succinate (TOPROL -XL) 25 MG 24 hr tablet Take 1 tablet (25 mg total) by mouth daily. 11/03/22   Nathanel Bal, PA-C  Multiple Vitamin (MULTIVITAMIN) capsule Take 1 capsule by mouth daily. 10/14/17   [provider]  Polyethyl Glycol-Propyl Glycol (LUBRICATING EYE DROPS OP) Place 1 drop into both eyes daily as needed (dry eyes).    [provider]  polyethylene glycol (MIRALAX  / GLYCOLAX ) 17 g packet Take 17 g by mouth every other  day. 05/02/18   [provider]  triamcinolone (NASACORT) 55 MCG/ACT AERO nasal inhaler Place 2 sprays into the nose daily as needed (allergies). 04/21/21   [provider]      Allergies    Other    Review of Systems   Review of Systems  Constitutional:  Negative for chills and fever.  Respiratory:  Negative for shortness of breath.   Cardiovascular:  Negative for chest pain.  Gastrointestinal:  Negative for abdominal pain.  Skin:  Positive for wound.  Neurological:  Positive for headaches. Negative for dizziness.  All other systems  reviewed and are negative.   Physical Exam Updated Vital Signs BP (!) 143/55   Pulse 73   Temp 98 F (36.7 C) (Oral)   Resp 19   Ht 4' 10.5" (1.486 m)   Wt 44.5 kg   SpO2 98%   BMI 20.13 kg/m  Physical Exam Vitals and nursing note reviewed.  Constitutional:      Appearance: Normal appearance.  HENT:     Head: Normocephalic.     Comments: Small laceration to the posterior aspect of the head.     Nose: Nose normal.     Mouth/Throat:     Mouth: Mucous membranes are moist.  Cardiovascular:     Rate and Rhythm: Normal rate.  Pulmonary:     Effort: Pulmonary effort is normal.     Breath sounds: No wheezing or rales.     Comments: No absent breath sounds.  Abdominal:     General: Abdomen is flat.  Musculoskeletal:     Cervical back: Normal range of motion and neck supple.       Legs:     Comments: Please see photos attached.   Skin:    General: Skin is warm and dry.     Findings: Erythema present.  Neurological:     Mental Status: Chelsea Petersen is alert and oriented to person, place, and time.        ED Results / Procedures / Treatments   Labs (all labs ordered are listed, but only abnormal results are displayed) Labs Reviewed  CBC WITH DIFFERENTIAL/PLATELET - Abnormal; Notable for the following components:      Result Value   WBC 18.5 (*)    RBC 3.76 (*)    Hemoglobin 11.2 (*)    HCT 33.5 (*)    RDW 19.9 (*)    Neutro Abs 16.3 (*)    Lymphs Abs 0.6 (*)    Abs Immature Granulocytes 0.57 (*)    All other components within normal limits  COMPREHENSIVE METABOLIC PANEL WITH GFR - Abnormal; Notable for the following components:   Sodium 124 (*)    Chloride 89 (*)    Glucose, Bld 193 (*)    Total Protein 6.3 (*)    All other components within normal limits  URINALYSIS, ROUTINE W REFLEX MICROSCOPIC - Abnormal; Notable for the following components:   APPearance HAZY (*)    Glucose, UA 50 (*)    Hgb urine dipstick SMALL (*)    Bacteria, UA RARE (*)    All other  components within normal limits  I-STAT CHEM 8, ED - Abnormal; Notable for the following components:   Sodium 125 (*)    Chloride 91 (*)    Glucose, Bld 196 (*)    Hemoglobin 11.6 (*)    HCT 34.0 (*)    All other components within normal limits  PROTIME-INR  I-STAT CG4 LACTIC ACID, ED  EKG None  Radiology DG Chest Portable 1 View Result Date: 07/03/2023 CLINICAL DATA:  Fall EXAM: PORTABLE CHEST 1 VIEW COMPARISON:  April 07, 2023 FINDINGS: The cardiomediastinal silhouette is unchanged in contour.Atherosclerotic calcifications. No pleural effusion. No pneumothorax. No acute pleuroparenchymal abnormality. IMPRESSION: No acute cardiopulmonary abnormality. Electronically Signed   By: Clancy Crimes M.D.   On: 07/03/2023 14:15   CT HEAD WO CONTRAST Result Date: 07/03/2023 CLINICAL DATA:  Head trauma, status post fall. EXAM: CT HEAD WITHOUT CONTRAST TECHNIQUE: Contiguous axial images were obtained from the base of the skull through the vertex without intravenous contrast. RADIATION DOSE REDUCTION: This exam was performed according to the departmental dose-optimization program which includes automated exposure control, adjustment of the mA and/or kV according to patient size and/or use of iterative reconstruction technique. COMPARISON:  None Available. FINDINGS: Brain: Scratch no intracranial hemorrhage, mass effect, or midline shift. Generalized atrophy. No hydrocephalus. The basilar cisterns are patent. Mild periventricular and deep white matter hypodensity typical of chronic small vessel ischemia. 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 No evidence of territorial infarct or acute ischemia. No extra-axial or intracranial fluid collection. Vascular: Atherosclerosis of skullbase vasculature without hyperdense vessel or abnormal calcification. Skull: No fracture or focal lesion. Sinuses/Orbits: No acute findings. Postsurgical change in the globes. Other: No confluent scalp hematoma.  IMPRESSION: 1. No acute intracranial abnormality. No skull fracture. 2. Generalized atrophy and chronic small vessel ischemia. 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 Electronically Signed   By: Chadwick Colonel M.D.   On: 07/03/2023 12:39   CT CERVICAL SPINE WO CONTRAST Result Date: 07/03/2023 CLINICAL DATA:  Blunt trauma, fall. EXAM: CT CERVICAL SPINE WITHOUT CONTRAST TECHNIQUE: Multidetector CT imaging of the cervical spine was performed without intravenous contrast. Multiplanar CT image reconstructions were also generated. RADIATION DOSE REDUCTION: This exam was performed according to the departmental dose-optimization program which includes automated exposure control, adjustment of the mA and/or kV according to patient size and/or use of iterative reconstruction technique. COMPARISON:  None Available. FINDINGS: Alignment: Broad-based reversal of normal lordosis. Multilevel listhesis, including 3 mm anterolisthesis of C2 on C3, 3 mm anterolisthesis of C4 on C5 and 3 mm anterolisthesis of C7 on T1. There is no evidence of traumatic subluxation. Skull base and vertebrae: No evidence of acute fracture. The dens and skull base are intact. Degenerative pannus at C1-C2. Soft tissues and spinal canal: No prevertebral fluid or swelling. No visible canal hematoma. Disc levels: Advanced diffuse degenerative disc disease with moderate multilevel facet hypertrophy. Upper chest: Aortic atherosclerosis.  No acute findings. Other: None. IMPRESSION: 1. No acute fracture or subluxation of the cervical spine. 2. Advanced diffuse degenerative disc disease and facet hypertrophy. Aortic Atherosclerosis (ICD10-I70.0). Electronically Signed   By: Chadwick Colonel M.D.   On: 07/03/2023 12:35   DG Tibia/Fibula Right Result Date: 07/03/2023 CLINICAL DATA:  Fall. EXAM: RIGHT TIBIA AND FIBULA - 2 VIEW COMPARISON:  None Available. FINDINGS: There is no evidence of fracture or other focal bone lesions. Soft tissues are  unremarkable. IMPRESSION: Negative. Electronically Signed   By: Rosalene Colon M.D.   On: 07/03/2023 12:27   DG Pelvis Portable Result Date: 07/03/2023 CLINICAL DATA:  Fall. EXAM: PORTABLE PELVIS 1-2 VIEWS COMPARISON:  None Available. FINDINGS: There is no evidence of pelvic fracture or diastasis. No pelvic bone lesions are seen. IMPRESSION: Negative. Electronically Signed   By: Rosalene Colon M.D.   On: 07/03/2023 11:57    Procedures Procedures    Medications Ordered in  ED Medications  lidocaine -EPINEPHrine (XYLOCAINE  W/EPI) 2 %-1:200000 (PF) injection (has no administration in time range)  lidocaine -EPINEPHrine (XYLOCAINE  W/EPI) 2 %-1:200000 (PF) injection 20 mL (has no administration in time range)  Tdap (BOOSTRIX) injection 0.5 mL (0.5 mLs Intramuscular Given 07/03/23 1155)  acetaminophen  (TYLENOL ) tablet 1,000 mg (1,000 mg Oral Given 07/03/23 1156)    ED Course/ Medical Decision Making/ A&P Clinical Course as of 07/03/23 1507  Sun Jul 03, 2023  1426 WBC(!): 18.5 No source of infection  [JS]    Clinical Course User Index [JS] Kayton Ripp, PA-C                                 Medical Decision Making Amount and/or Complexity of Data Reviewed Labs: ordered. Decision-making details documented in ED Course. Radiology: ordered.  Risk OTC drugs. Prescription drug management.    This patient presents to the ED for concern of fall, this involves a number of treatment options, and is a complaint that carries with it a high risk of complications and morbidity.    Co morbidities: Discussed in HPI   Brief History:  See HPI.   EMR reviewed including pt PMHx, past surgical history and past visits to ER.   See HPI for more details   Lab Tests:  I ordered and independently interpreted labs.  The pertinent results include:   Interpretation of blood work by me reveals CBC with leukocytosis of 18,000, Chelsea Petersen denies any fevers, cough, urinary symptoms.  CMP with hyponatremia,  creatinine levels within normal limits.  LFTs are within normal limits.  PT/INR normal.  Lactic acid is negative.   Imaging Studies:  CT Head and Cervical spine: IMPRESSION:  1. No acute intracranial abnormality. No skull fracture.  2. Generalized atrophy and chronic small vessel ischemia. Remote  lacunar infarct in the right caudate.      Electronically Signed   X-ray of the right tibia, pelvis without any acute finding.  Medicines ordered:  I ordered medication including tylenol   for head pain Reevaluation of the patient after these medicines showed that the patient improved I have reviewed the patients home medicines and have made adjustments as needed  Reevaluation:  After the interventions noted above I re-evaluated patient and found that they have :stayed the same  Social Determinants of Health:  The patient's social determinants of health were a factor in the care of this patient   Problem List / ED Course:  Patient presented to the ED with a chief complaint of mechanical fall, reports he was getting out of bed with her walker when suddenly her legs slipped Chelsea Petersen fell to the ground, skinned her right leg on her walker, reports Chelsea Petersen hit the back of her head.  Chelsea Petersen is anticoagulated on Eliquis , Level 2 trauma was called.  Patient with stable vital signs, I did visualize bleeding from the posterior aspect of her head, no laceration that requires repair at this time. CBC with leukocytosis of 1800, suspect likely reactive due to fall.  Hemoglobin slightly decreased.  CMP remarkable for hyponatremia, this is a known finding for her.  UA without any nitrite or leukocyte.  Chest x-ray was ordered due to increase in leukocytosis, in order to rule out any infection.  So far her workup is clear. CT head and cervical spine without any acute finding.  X-ray of her tibia and fibula were ordered, no bone involvement.  Pelvis is also normal.  Chelsea Petersen did  have a repair of her wound with 2 stitches  to the right side, compression dressing along with wet-to-dry dressings will need to be performed.  I did speak to her son, Chelsea Petersen is currently in independent living facility will need to transition to assisted living for the next few weeks.  They do believe that they are able to do so. In addition, discussed case with my attending Dr. Manus Sellers, we do feel that patient would require antibiotics at this time due to the extensive size of the wound, will go home on a short course of Keflex.  Chelsea Petersen does have wound care at the facility.  Will also provide her with a referral.  Patient is hemodynamically stable for discharge.  Dispostion:  After consideration of the diagnostic results and the patients response to treatment, I feel that the patent would benefit from antibiotic course to help prevent any further infection.     Portions of this note were generated with Scientist, clinical (histocompatibility and immunogenetics). Dictation errors may occur despite best attempts at proofreading.   Final Clinical Impression(s) / ED Diagnoses Final diagnoses:  Fall, initial encounter  Leg laceration, right, initial encounter    Rx / DC Orders ED Discharge Orders          Ordered    cephALEXin (KEFLEX) 500 MG capsule  2 times daily        07/03/23 1502              Cruise Baumgardner, PA-C 07/03/23 1507    Tegeler, Marine Sia, MD 07/03/23 1606

## 2023-07-03 NOTE — ED Notes (Signed)
 Trauma Response Nurse Documentation   Chelsea Petersen is a 83 y.o. female arriving to [facility] ED via EMS  On Eliquis  (apixaban ) daily. Trauma was activated as a Level 2 by ED Charge RN based on the following trauma criteria Elderly patients > 65 with head trauma on anti-coagulation (excluding ASA).  Patient cleared for CT by Dr. Manus Sellers. Pt transported to CT with trauma response nurse present to monitor. RN remained with the patient throughout their absence from the department for clinical observation.   GCS 15.  History   Past Medical History:  Diagnosis Date   Breast cancer (HCC)    Diverticulitis    Hypertension    Uterine cancer (HCC)      Past Surgical History:  Procedure Laterality Date   ABDOMINAL HYSTERECTOMY     BREAST LUMPECTOMY     CARDIOVERSION N/A 07/09/2022   Procedure: CARDIOVERSION;  Surgeon: Sheryle Donning, MD;  Location: [facility] INVASIVE CV LAB;  Service: Cardiovascular;  Laterality: N/A;   CARDIOVERSION N/A 10/19/2022   Procedure: CARDIOVERSION;  Surgeon: Mardell Shade, MD;  Location: [facility] INVASIVE CV LAB;  Service: Cardiovascular;  Laterality: N/A;   EYE SURGERY       Initial Focused Assessment (If applicable, or please see trauma documentation): Airway: intact, patent Breathing: Breath sounds clear, equal bilaterally Circulation: Laceration with subcutaneous fat exposed to R lower leg.  Bleeding currently controlled.   Contusion noted to posterior head.  PIV in place.  Disability: A&Ox4, PERRLA, MAE appropriately.   CT's Completed:   CT Head and CT C-Spine   Interventions:  CXR Pelvic XR Labs drawn CT head and c-spine  Tylenol  given for pain Tdap given Lido with epi given, leg sutured.   Plan for disposition:  Discharge home   Consults completed:  none at 1230.  Event Summary: Pt was attempting to get out of bed to her walker when her feet slipped and she fell, striking the back of her head and hitting her LLE on her walker.  Pt  denies LOC.  Pt takes eliquis  BID for a-flutter.  Pt and son are unsure if she has had a double dose today or not due to pt taking "Monday's" meds.  PT/INR performed and WDL.  Pt A/Ox4.   Bedside handoff with ED RN Carolynne Citron.    Chelsea Petersen  Trauma Response RN  Please call TRN at [phone] for further assistance.

## 2023-07-03 NOTE — Progress Notes (Signed)
 Orthopedic Tech Progress Note Patient Details:  Chelsea Petersen 02/12/1941 [id number]  Patient ID: Daryel Ensign, female   DOB: 1940/10/19, 83 y.o.   MRN: [id number] Level II; not currently needed. Toi Foster 07/03/2023, 12:18 PM

## 2023-07-23 NOTE — Progress Notes (Unsigned)
 Electrophysiology Office Note:   Date:  07/25/2023  ID:  Chanelle Hodsdon, DOB 1941/01/31, MRN [id number]  Primary Cardiologist: None Primary Heart Failure: None Electrophysiologist: Kynlei Piontek Cortland Ding, MD      History of Present Illness:   Chelsea Petersen is a 83 y.o. female with h/o hypertension, breast cancer, atrial flutter seen today for routine electrophysiology followup.   Since last being seen in our clinic the patient reports no further evidence of atrial flutter.  She has had a few other issues recently.  She was in the hospital for lung issues.  She had an echo that showed a mildly reduced RV function and some elevated RV pressures concerning for pulmonary hypertension.  LV function was normal.  It was also thought that she may be having some issues with amiodarone  lung toxicity.  Amiodarone  was stopped approximately 9 weeks ago.  She has also had a few falls.  She slipped out of her bed as she put her feet on the floor.  She has a laceration of her right leg which is wrapped.  she denies chest pain, palpitations, dyspnea, PND, orthopnea, nausea, vomiting, dizziness, syncope, edema, weight gain, or early satiety.   Review of systems complete and found to be negative unless listed in HPI.   EP Information / Studies Reviewed:    EKG is ordered today. Personal review as below.  EKG Interpretation Date/Time:  Monday July 25 2023 11:35:21 EDT Ventricular Rate:  64 PR Interval:  196 QRS Duration:  86 QT Interval:  414 QTC Calculation: 427 R Axis:   87  Text Interpretation: Normal sinus rhythm Possible Left atrial enlargement Left ventricular hypertrophy with repolarization abnormality ( Sokolow-Lyon , Romhilt-Estes ) When compared with ECG of 02-Nov-2022 13:57, Premature atrial complexes are no longer Present Confirmed by Maleyah Evans ([id number]) on 07/25/2023 11:54:21 AM     Risk Assessment/Calculations:    CHA2DS2-VASc Score = 4   This indicates a 4.8% annual risk of stroke. The  patient's score is based upon: CHF History: 0 HTN History: 1 Diabetes History: 0 Stroke History: 0 Vascular Disease History: 0 Age Score: 2 Gender Score: 1            Physical Exam:   VS:  BP (!) 148/62 (BP Location: Right Arm, Patient Position: Sitting, Cuff Size: Small)   Pulse 64   Ht 4' 10.5" (1.486 m)   Wt 95 lb (43.1 kg)   SpO2 98%   BMI 19.52 kg/m    Wt Readings from Last 3 Encounters:  07/25/23 95 lb (43.1 kg)  07/03/23 98 lb (44.5 kg)  11/02/22 106 lb (48.1 kg)     GEN: Well nourished, well developed in no acute distress NECK: No JVD; No carotid bruits CARDIAC: Regular rate and rhythm, no murmurs, rubs, gallops RESPIRATORY:  Clear to auscultation without rales, wheezing or rhonchi  ABDOMEN: Soft, non-tender, non-distended EXTREMITIES:  No edema; No deformity   ASSESSMENT AND PLAN:    1.  Typical atrial flutter: On amiodarone .  Had cardioversion 10/19/2022.  She remains in sinus rhythm.  She would prefer a rhythm control strategy, but is unclear if she would prefer medications or ablation.  We discussed both of these.  She Marq Rebello call us  back and let us  know how she wants to proceed.  2.  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  Hypertension: Mildly elevated.  Usually well-controlled.  No changes.   Follow up with Dr. Lawana Pray pending decision on rhythm control   Signed, Nyana Haren Cortland Ding,  MD

## 2023-07-25 ENCOUNTER — Encounter: Payer: Self-pay | Admitting: Cardiology

## 2023-07-25 ENCOUNTER — Ambulatory Visit: Payer: Medicare Other | Attending: Cardiology | Admitting: Cardiology

## 2023-07-25 VITALS — BP 148/62 | HR 64 | Ht 58.5 in | Wt 95.0 lb

## 2023-07-25 DIAGNOSIS — I483 Typical atrial flutter: Secondary | ICD-10-CM | POA: Diagnosis not present

## 2023-07-25 DIAGNOSIS — I10 Essential (primary) hypertension: Secondary | ICD-10-CM

## 2023-07-25 DIAGNOSIS — D6869 Other thrombophilia: Secondary | ICD-10-CM | POA: Insufficient documentation

## 2023-07-25 NOTE — Patient Instructions (Signed)
 Medication Instructions:  Your physician recommends that you continue on your current medications as directed. Please refer to the Current Medication list given to you today.  *If you need a refill on your cardiac medications before your next appointment, please call your pharmacy*  Lab Work: None ordered  If you have any lab test that is abnormal or we need to change your treatment, we will call you to review the results.  Testing/Procedures: Your physician has recommended that you have an ablation. Catheter ablation is a medical procedure used to treat some cardiac arrhythmias (irregular heartbeats). During catheter ablation, a long, thin, flexible tube is put into a blood vessel in your groin (upper thigh), or neck. This tube is called an ablation catheter. It is then guided to your heart through the blood vessel. Radio frequency waves destroy small areas of heart tissue where abnormal heartbeats may cause an arrhythmia to start.   Please call the office and let us  know your decision.   Follow-Up: At [facility], you and your health needs are our priority.  As part of our continuing mission to provide you with exceptional heart care, our providers are all part of one team.  This team includes your primary Cardiologist (physician) and Advanced Practice Providers or APPs (Physician Assistants and Nurse Practitioners) who all work together to provide you with the care you need, when you need it.  Your next appointment:   To be determined  Provider:   Agatha Horsfall, MD     Thank you for choosing [facility]!!   Reece Cane, RN [phone]   Other Instructions  Cardiac Ablation Cardiac ablation is a procedure to destroy (ablate) heart tissue that is sending bad signals. These bad signals cause the heart to beat very fast or in a way that is not normal. Destroying some tissues can help make the heart rhythm normal. Tell your doctor about: Any allergies you have. All  medicines you are taking. These include vitamins, herbs, eye drops, creams, and over-the-counter medicines. Any problems you or family members have had with anesthesia. Any bleeding problems you have. Any surgeries you have had. Any medical conditions you have. Whether you are pregnant or may be pregnant. What are the risks? Your doctor will talk with you about risks. These may include: Infection. Bruising and bleeding. Stroke or blood clots. Damage to nearby areas of your body. Allergies to medicines or dyes. Needing a pacemaker if the heart gets damaged. A pacemaker helps the heart beat normally. The procedure not working. What happens before the procedure? Medicines Ask your doctor about changing or stopping: Your normal medicines. Vitamins, herbs, and supplements. Over-the-counter medicines. Do not take aspirin or ibuprofen unless you are told to. General instructions Follow instructions from your doctor about what you may eat and drink. If you will be going home right after the procedure, plan to have a responsible adult: Take you home from the hospital or clinic. You will not be allowed to drive. Care for you for the time you are told. Ask your doctor what steps will be taken to prevent the spread of germs. What happens during the procedure?  An IV tube will be put into one of your veins. You may be given: A sedative. This helps you relax. Anesthesia. This will: Numb certain areas of your body. The skin on your neck or groin will be numbed. A cut (incision) will be made in your neck or groin. A needle will be put through the cut  and into a large vein. The small, thin tube (catheter) will be put into the needle. The tube will be moved to your heart. A type of X-ray (fluoroscopy) will be used to help guide the tube. It will also show constant images of the heart on a screen. Dye may be put through the tube. This helps your doctor see your heart. An electric current will  be sent from the tube to destroy heart tissue in certain areas. The tube will be taken out. Pressure will be held on your cut. This helps stop bleeding. A bandage (dressing) will be put over your cut. The procedure may vary among doctors and hospitals. What happens after the procedure? You will be monitored until you leave the hospital or clinic. This includes checking your blood pressure, heart rate and rhythm, breathing rate, and blood oxygen level. Your cut will be checked for bleeding. You will need to lie still for a few hours. If your groin was used, you will need to keep your leg straight for a few hours after the small, thin tube is removed. This information is not intended to replace advice given to you by your health care provider. Make sure you discuss any questions you have with your health care provider. Document Revised: 07/21/2021 Document Reviewed: 07/21/2021 [employer]vier Patient Education  2024 [employer].
